# Patient Record
Sex: Female | Born: 1945 | Race: White | Hispanic: No | Smoking: Never smoker
Health system: Southern US, Community
[De-identification: ages and names within clinical notes are randomized; demographics above are authoritative.]

## PROBLEM LIST (undated history)

## (undated) DIAGNOSIS — M1990 Unspecified osteoarthritis, unspecified site: Secondary | ICD-10-CM

## (undated) DIAGNOSIS — G20 Parkinson's disease: Secondary | ICD-10-CM

## (undated) DIAGNOSIS — G20A1 Parkinson's disease without dyskinesia, without mention of fluctuations: Secondary | ICD-10-CM

## (undated) DIAGNOSIS — E785 Hyperlipidemia, unspecified: Secondary | ICD-10-CM

## (undated) DIAGNOSIS — N390 Urinary tract infection, site not specified: Secondary | ICD-10-CM

## (undated) DIAGNOSIS — G629 Polyneuropathy, unspecified: Secondary | ICD-10-CM

## (undated) DIAGNOSIS — F32A Depression, unspecified: Secondary | ICD-10-CM

## (undated) DIAGNOSIS — I10 Essential (primary) hypertension: Secondary | ICD-10-CM

## (undated) DIAGNOSIS — C50919 Malignant neoplasm of unspecified site of unspecified female breast: Secondary | ICD-10-CM

## (undated) DIAGNOSIS — F329 Major depressive disorder, single episode, unspecified: Secondary | ICD-10-CM

## (undated) HISTORY — DX: Major depressive disorder, single episode, unspecified: F32.9

## (undated) HISTORY — DX: Parkinson's disease: G20

## (undated) HISTORY — DX: Depression, unspecified: F32.A

## (undated) HISTORY — DX: Polyneuropathy, unspecified: G62.9

## (undated) HISTORY — PX: REVISION TOTAL KNEE ARTHROPLASTY: SHX767

## (undated) HISTORY — DX: Unspecified osteoarthritis, unspecified site: M19.90

## (undated) HISTORY — DX: Parkinson's disease without dyskinesia, without mention of fluctuations: G20.A1

## (undated) HISTORY — PX: OTHER SURGICAL HISTORY: SHX169

## (undated) HISTORY — DX: Essential (primary) hypertension: I10

## (undated) HISTORY — DX: Malignant neoplasm of unspecified site of unspecified female breast: C50.919

## (undated) HISTORY — PX: TOTAL KNEE REVISION: SHX996

## (undated) HISTORY — DX: Hyperlipidemia, unspecified: E78.5

---

## 1980-10-04 HISTORY — PX: ABDOMINAL HYSTERECTOMY: SHX81

## 1990-10-04 HISTORY — PX: RENAL ANGIOPLASTY: SHX2316

## 1997-10-04 HISTORY — PX: ABDOMINAL EXPLORATION SURGERY: SHX538

## 2012-04-03 HISTORY — PX: BREAST LUMPECTOMY: SHX2

## 2012-04-28 HISTORY — PX: MASTECTOMY: SHX3

## 2017-08-29 ENCOUNTER — Encounter: Payer: Self-pay | Admitting: Neurology

## 2017-09-15 ENCOUNTER — Encounter: Payer: Self-pay | Admitting: Neurology

## 2017-09-20 NOTE — Progress Notes (Addendum)
Deborah Vincent was seen today in the movement disorders clinic for neurologic consultation at the request of Hadi, Ehsan.  The consultation is for the evaluation of Parkinsons disease.  She was previously taken care of by [facility] group in [place], [place].  I have reviewed notes available to me.  She is accompanied by her daughter who supplements the history.  Records indicate that the patient's first symptom was in 2012.  Her first symptom was difficulty ambulating.  The first medication that she was placed on was azilect and then pramipexole was added at low dose.  She was last seen in the clinic in [place] in July, 2018.  At that point time it was recommended that her pramipexole be increased.  She is supposed to be on 0.25 mg, 1.5 tablets/ 1.5 tablets/ 2 tablets/half a tablet.  This was a total daily pramipexole dosage of 1.375 mg.  She sometimes takes 0.25 mg, 1.5 tablets at 7am, 0.5 tablets at 2pm and 2 tablets at bedtime.  She states that sometimes she will add 1/2 tablet at 4am to avoid AM tremor.   She thinks that medication helps but makes her nauseated.  This is a total daily dose of 1.0-1.25mg daily    Specific Symptoms:  Tremor: Yes.  , R arm and right leg Family hx of similar:  No. Voice: gotten softer Sleep: trouble staying asleep  Vivid Dreams:  No.  Acting out dreams:  No. Wet Pillows: No. Postural symptoms:  Yes.    Falls?  Yes.  , one time getting out of a recliner Bradykinesia symptoms: shuffling gait, slow movements and difficulty getting out of a chair (tried to do LSVT BIG but had back pain and stopped.  Did well with LSVT Loud 2 years ago) Loss of smell:  No. Loss of taste:  No. Urinary Incontinence:  No. Difficulty Swallowing:  No. Handwriting, micrographia: Yes.   Trouble with ADL's:  Yes.  , but can do these things and attributes to arthritis in the knees  Trouble buttoning clothing: No. Depression:  Yes.   Memory changes:  Yes.   - not as  good as used to be.  Living with daughter right now but planning on buying house near daughter.  Daughter would like caregivers to come in Hallucinations:  No.  visual distortions: No. N/V:  No. Lightheaded:  No.  Syncope: No. Diplopia:  No. Dyskinesia:  No.  Neuroimaging of the brain has previously been performed.  It is not available for my review today.  PREVIOUS MEDICATIONS: Mirapex and azilect  ALLERGIES:   Allergies  Allergen Reactions  . Nubain [Nalbuphine Hcl]   . Sulfa Antibiotics   . Vicodin [Hydrocodone-Acetaminophen]     CURRENT MEDICATIONS:  Outpatient Encounter Medications as of 09/22/2017  Medication Sig  . acetaminophen (TYLENOL) 325 MG tablet Take 650 mg by mouth every 6 (six) hours as needed.  Marland Kitchen anastrozole (ARIMIDEX) 1 MG tablet Take 1 mg by mouth daily.  . Ascorbic Acid (VITAMIN C) 1000 MG tablet Take 1,000 mg by mouth daily.  Marland Kitchen aspirin 325 MG tablet Take 325 mg by mouth daily.  Marland Kitchen buPROPion (WELLBUTRIN) 100 MG tablet Take 100 mg by mouth daily.  . folic acid (FOLVITE) 1 MG tablet Take 1 mg by mouth daily.  Marland Kitchen l-methylfolate-B6-B12 (METANX) 3-35-2 MG TABS tablet Take 1 tablet by mouth daily.  Marland Kitchen lisinopril (PRINIVIL,ZESTRIL) 5 MG tablet Take 5 mg by mouth daily.  Marland Kitchen MAGNESIUM CITRATE PO Take by mouth.  Marland Kitchen  Melatonin 5 MG TABS Take by mouth.  . pramipexole (MIRAPEX) 0.25 MG tablet 1.5 in the morning, 0.5 in the afternoon, 2 in the evening  . rasagiline (AZILECT) 1 MG TABS tablet Take 1 mg by mouth daily.  . shark liver oil-cocoa butter (PREPARATION H) 0.25-3-85.5 % suppository Place 1 suppository rectally as needed for hemorrhoids.  . [DISCONTINUED] pramipexole (MIRAPEX) 0.25 MG tablet Take by mouth. 1.5 in the morning, 1.5 in the afternoon, 2 in the evening   No facility-administered encounter medications on file as of 09/22/2017.     PAST MEDICAL HISTORY:   Past Medical History:  Diagnosis Date  . Breast cancer ([place])   . Depression   . Hyperlipidemia   .  Hypertension   . Neuropathy   . Osteoarthritis   . Parkinson's disease ([place])     PAST SURGICAL HISTORY:   Past Surgical History:  Procedure Laterality Date  . ABDOMINAL EXPLORATION SURGERY  1999   intestinal adhesions  . ABDOMINAL HYSTERECTOMY  1982  . BREAST LUMPECTOMY Left 04/03/2012  . MASTECTOMY Left 04/28/2012  . RENAL ANGIOPLASTY Left 1992    SOCIAL HISTORY:   Social History   Socioeconomic History  . Marital status: Unknown    Spouse name: Not on file  . Number of children: Not on file  . Years of education: Not on file  . Highest education level: Not on file  Social Needs  . Financial resource strain: Not on file  . Food insecurity - worry: Not on file  . Food insecurity - inability: Not on file  . Transportation needs - medical: Not on file  . Transportation needs - non-medical: Not on file  Occupational History  . Occupation: retired    Comment: Designer, jewellery  Tobacco Use  . Smoking status: Never Smoker  . Smokeless tobacco: Never Used  Substance and Sexual Activity  . Alcohol use: No    Frequency: Never    Comment: prior alcohol dependence  . Drug use: Not on file  . Sexual activity: Not on file  Other Topics Concern  . Not on file  Social History Narrative  . Not on file    FAMILY HISTORY:   Family Status  Relation Name Status  . Mother  Deceased  . Father  Deceased  . Sister  Deceased  . Brother  Deceased  . Brother  Alive  . Child 2 Alive  . Child step Alive    ROS:  Feels nauseated.  A complete 10 system review of systems was obtained and was unremarkable apart from what is mentioned above.  PHYSICAL EXAMINATION:    VITALS:   Vitals:   09/22/17 0859  BP: 140/88  Pulse: 96  SpO2: 96%  Weight: 196 lb (88.9 kg)  Height: 5' 1" (1.549 m)    GEN:  The patient appears stated age.  She doesn't feel well. HEENT:  Normocephalic, atraumatic.  The mucous membranes are moist. The superficial temporal arteries are without ropiness or  tenderness. CV:  RRR Lungs:  CTAB Neck/HEME:  There are no carotid bruits bilaterally.  Neurological examination:  Orientation: The patient is alert and oriented x3. Fund of knowledge is appropriate.  Recent and remote memory are intact.  Attention and concentration are normal.    Able to name objects and repeat phrases. Cranial nerves: There is good facial symmetry.  She has facial hypomania.  She stopped the examination before I was able to look at her pupils or examine the fundus.  The visual  fields are full to confrontational testing. The speech is fluent and clear.  She is hypophonic.  Soft palate rises symmetrically and there is no tongue deviation. Hearing is intact to conversational tone. Sensation: Sensation is intact to light touch.  She stopped the examination before more sensitive aspects of the sensory testing could be tested. Motor: Strength is 5/5 in the bilateral upper and lower extremities.   Shoulder shrug is equal and symmetric.  There is no pronator drift. Deep tendon reflexes: Deep tendon reflexes are 2/4 at the bilateral biceps, triceps, brachioradialis, patella and achilles. Plantar responses are downgoing bilaterally.  Movement examination: Tone: There is mild increased tone in the right upper extremity.  There is moderate increased tone in the right lower extremity.  There is normal tone on the left. Abnormal movements: There is intermittent right upper extremity resting tremor. Coordination:  There is decremation with RAM's, with finger taps on the right and hand opening and closing on the left.  She has significant trouble with heel taps and toe taps on the right.  Other rapid alternating movements are good. Gait and Station: The patient pushes off of the chair to get out of the chair.  She shuffles.  She otherwise does not allow for ambulatory testing.  She does not allow for pole testing.  She stopped the examination because she did not feel well and felt like she was  going to throw up.  labs  Lab work received from primary care physician dated June 07, 2017.  Sodium was 139, potassium 4.8, chloride 100, CO2 23, BUN 22, creatinine 0.96 and glucose 107.  Hemoglobin A1c was 5.9.  ASSESSMENT/PLAN:  1.  Idiopathic Parkinson's disease.  The patient has tremor, bradykinesia, rigidity and postural instability.  -We discussed the diagnosis as well as pathophysiology of the disease.  We discussed treatment options as well as prognostic indicators.  Patient education was provided.  -We discussed that it used to be thought that levodopa would increase risk of melanoma but now it is believed that Parkinsons itself likely increases risk of melanoma. she is to get regular skin checks.  -Greater than 50% of the 45 minute visit was spent in counseling answering questions and talking about what to expect now as well as in the future.  We talked about medication options as well as potential future surgical options.  We talked about safety in the home.  -I think that the patient would greatly benefit from levodopa.  She and I talked about risks and benefits of this.  We talked about the fact that she had significant rigidity.  In addition, she is complaining about cognitive change and nausea with her current medication.  She is on a very low-dose of pramipexole.  My recommendation would be to add levodopa and to slowly wean the pramipexole when she is up to a better dose of levodopa.  She would like to follow-up with her neurologist in [place].  She already has an appointment next month.  She would like to discuss this with him.  -The patient did not do well with LSVT big because of back pain.  We talked about the Parkinson's wellness recovery therapy program here.  I think she would be able to engage with this.  We discussed this with her today.  She can decide if she would like a referral and will let me know if she would like me to send that.  -As above, she is complaining  about some cognitive change.  We talked  about neurocognitive testing and the benefit of that.  I would not recommend that until her medications get reworked.  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  -We discussed community resources in the area including patient support groups and community exercise programs for PD and pt education was provided to the patient.  -She met with my Parkinson's social worker today.   Cc:  System, Pcp Not In

## 2017-09-22 ENCOUNTER — Ambulatory Visit: Payer: Medicare Other | Admitting: Neurology

## 2017-09-22 ENCOUNTER — Encounter: Payer: Self-pay | Admitting: Neurology

## 2017-09-22 VITALS — BP 140/88 | HR 96 | Ht 61.0 in | Wt 196.0 lb

## 2017-09-22 DIAGNOSIS — G20 Parkinson's disease: Secondary | ICD-10-CM | POA: Diagnosis not present

## 2018-04-18 DIAGNOSIS — F32A Depression, unspecified: Secondary | ICD-10-CM | POA: Insufficient documentation

## 2018-04-18 DIAGNOSIS — I10 Essential (primary) hypertension: Secondary | ICD-10-CM | POA: Insufficient documentation

## 2018-04-18 DIAGNOSIS — G20 Parkinson's disease: Secondary | ICD-10-CM | POA: Insufficient documentation

## 2018-04-18 DIAGNOSIS — M545 Low back pain, unspecified: Secondary | ICD-10-CM | POA: Insufficient documentation

## 2018-04-18 DIAGNOSIS — M1712 Unilateral primary osteoarthritis, left knee: Secondary | ICD-10-CM | POA: Insufficient documentation

## 2018-05-30 ENCOUNTER — Telehealth: Payer: Self-pay | Admitting: *Deleted

## 2018-05-30 ENCOUNTER — Ambulatory Visit: Payer: Medicare Other | Admitting: Neurology

## 2018-05-30 ENCOUNTER — Encounter: Payer: Self-pay | Admitting: Neurology

## 2018-05-30 VITALS — BP 118/88 | HR 100 | Ht 62.0 in | Wt 195.0 lb

## 2018-05-30 DIAGNOSIS — G20 Parkinson's disease: Secondary | ICD-10-CM

## 2018-05-30 NOTE — Progress Notes (Signed)
Subjective:    Patient ID: Deborah Vincent is a 72 y.o. female.  HPI     Star Age, MD, PhD [facility] [address], [address] [place], [place] [address]  Dear Danton Sewer,   I saw your patient, Deborah Vincent, upon your kind request, for initial consultation of her parkinsonism. The patient is accompanied by her daughter and her niece today. As you know, Deborah Vincent is a 72 year old right-handed woman with an underlying medical history of hypertension, breast cancer with status post mastectomy, low back pain, asthma, depression, insomnia, and obesity, who was diagnosed with Parkinson's disease in or around 2012. She has been on Azilect, Sinemet generic and Mirapex generic. She was seen last year by Dr. Carles Collet at [facility] neurology and I reviewed the note from 09/22/2017. Patient was advised to start levodopa at the time. She is currently on Sinemet generic 25-100 milligrams strength one pill 3 times a day, Azilect generic 1 mg strength one pill once daily, Mirapex generic 0.25 mg strength one pill 3 times a day. She has a Hx of pseudobulbar affect. Sleeps okay, wakes up 2 times a night for a bathroom break. Takes at least one nap during the day. Feels very nauseated. Started the Sinemet in 10/2017. Started the [place] in May 2016, Mirapex was started in November 2016. She has noted nausea after a while of taking the C/L. She is taking Mirapex 0.25 mg at 3 AM, noon and 8 PM. She takes Sinemet at 3 AM, 7 AM, 11 AM, 4 PM and 8 or 9 PM. She takes the Azilect in the morning typically between 8 and 10. She is in physical therapy currently. She does not typically go to the gym. She has low back pain and bilateral knee pain, left more than right. She has had issues with constipation. This became severely worse when she tried Imodium for nausea in the past. She has no family history of Parkinson's disease. She currently lives with a roommate and stays with her nephew in  [place]. She is retired, widowed, has 3 children, she is a nonsmoker. She drinks caffeine in the form of coffee, half cup per day on average. She tries to hydrate well. She feels that her nausea typically comes on before she is due for her next Sinemet dose. The nausea becomes worse after she takes it for about 20 minutes and then subsides, she feels the Sinemet work well for her.   Her Past Medical History Is Significant For: Past Medical History:  Diagnosis Date  . Breast cancer ([place])   . Depression   . Hyperlipidemia   . Hypertension   . Neuropathy   . Osteoarthritis   . Parkinson's disease ([place])     Her Past Surgical History Is Significant For: Past Surgical History:  Procedure Laterality Date  . ABDOMINAL EXPLORATION SURGERY  1999   intestinal adhesions  . ABDOMINAL HYSTERECTOMY  1982  . BREAST LUMPECTOMY Left 04/03/2012  . MASTECTOMY Left 04/28/2012  . RENAL ANGIOPLASTY Left 1992    Her Family History Is Significant For: Family History  Problem Relation Age of Onset  . Cancer Mother        GI   . Hypertension Mother   . Depression Mother   . Lung cancer Father   . Other Sister        Wagner's granuloma    Her Social History Is Significant For: Social History   Socioeconomic History  . Marital status: Unknown  Spouse name: Not on file  . Number of children: Not on file  . Years of education: Not on file  . Highest education level: Not on file  Occupational History  . Occupation: retired    Comment: Designer, jewellery  Social Needs  . Financial resource strain: Not on file  . Food insecurity:    Worry: Not on file    Inability: Not on file  . Transportation needs:    Medical: Not on file    Non-medical: Not on file  Tobacco Use  . Smoking status: Never Smoker  . Smokeless tobacco: Never Used  Substance and Sexual Activity  . Alcohol use: No    Frequency: Never    Comment: prior alcohol dependence  . Drug use: Not on file  . Sexual activity:  Not on file  Lifestyle  . Physical activity:    Days per week: Not on file    Minutes per session: Not on file  . Stress: Not on file  Relationships  . Social connections:    Talks on phone: Not on file    Gets together: Not on file    Attends religious service: Not on file    Active member of club or organization: Not on file    Attends meetings of clubs or organizations: Not on file    Relationship status: Not on file  Other Topics Concern  . Not on file  Social History Narrative  . Not on file    Her Allergies Are:  Allergies  Allergen Reactions  . Macrobid [Nitrofurantoin Macrocrystal]   . Nubain [Nalbuphine Hcl]   . Sulfa Antibiotics   . Vicodin [Hydrocodone-Acetaminophen]   :   Her Current Medications Are:  Outpatient Encounter Medications as of 05/30/2018  Medication Sig  . 5-Methyltetrahydrofolate (METHYL FOLATE) POWD 400 mg.  . acetaminophen (TYLENOL) 325 MG tablet Take 650 mg by mouth every 6 (six) hours as needed.  Marland Kitchen anastrozole (ARIMIDEX) 1 MG tablet Take 1 mg by mouth daily.  . Ascorbic Acid (VITAMIN C) 1000 MG tablet Take 1,000 mg by mouth daily.  Marland Kitchen aspirin 325 MG tablet Take 325 mg by mouth daily.  Marland Kitchen buPROPion (WELLBUTRIN) 100 MG tablet Take 100 mg by mouth daily.  . carbidopa-levodopa (SINEMET IR) 25-100 MG tablet Take 1 tablet by mouth 3 (three) times daily.  Marland Kitchen lisinopril (PRINIVIL,ZESTRIL) 5 MG tablet Take 5 mg by mouth daily.  Marland Kitchen MAGNESIUM CITRATE PO Take by mouth.  . Melatonin 5 MG TABS Take by mouth.  . pramipexole (MIRAPEX) 0.25 MG tablet Take 0.25 mg by mouth 3 (three) times daily. 1.5 in the morning, 0.5 in the afternoon, 2 in the evening   . rasagiline (AZILECT) 1 MG TABS tablet Take 1 mg by mouth daily.  . shark liver oil-cocoa butter (PREPARATION H) 0.25-3-85.5 % suppository Place 1 suppository rectally as needed for hemorrhoids.  . [DISCONTINUED] folic acid (FOLVITE) 1 MG tablet Take 1 mg by mouth daily.  . [DISCONTINUED] l-methylfolate-B6-B12  (METANX) 3-35-2 MG TABS tablet Take 1 tablet by mouth daily.   No facility-administered encounter medications on file as of 05/30/2018.   : Review of Systems:  Out of a complete 14 point review of systems, all are reviewed and negative with the exception of these symptoms as listed below:  Review of Systems  Neurological:       Pt presents today to discuss her PD. Pt was diagnosed in 63 in [place]. Pt has seen Dr. Carles Collet in the past but pt  reports that her PCP would prefer her to see Dr. Rexene Alberts.    Objective:  Neurological Exam  Physical Exam Physical Examination:   Vitals:   05/30/18 1013  BP: 118/88  Pulse: 100    General Examination: The patient is a very pleasant 72 y.o. female in no acute distress. She appears well-developed and well-nourished and well groomed.   HEENT: Normocephalic, atraumatic, pupils are equal, round and reactive, extraocular tracking is mildly impaired, she has mild facial masking and mild nuchal rigidity. Hearing is grossly intact. She has no obvious sialorrhea. Airway examination is benign. She has no lip, neck or jaw tremor. She has mild hypophonia.  Chest: Clear to auscultation without wheezing, rhonchi or crackles noted.  Heart: S1+S2+0, regular and normal without murmurs, rubs or gallops noted.   Abdomen: Soft, non-tender and non-distended with normal bowel sounds appreciated on auscultation.  Extremities: There is no obvious edema, mild puffiness noted.   Skin: Warm and dry without trophic changes noted.   Musculoskeletal: exam reveals L knee pain, left knee in a brace.   Neurologically:  Mental status: The patient is awake, alert and oriented in all 4 spheres. Her immediate and remote memory, attention, language skills and fund of knowledge are appropriate. There is no evidence of aphasia, agnosia, apraxia or anomia. Speech is mildly hypophonic, no dysarthria noted. Cranial nerves II - XII are as described above under HEENT exam.  Motor  exam: Normal bulk, global strength of 4+ out of 5 is noted. She has no significant resting tremor today. She has increase in tone particularly in the right upper extremity with cogwheeling noted. Fine motor skills are mild to moderately impaired globally, seems slightly worse on the left. She stands up with mild difficulty, she has a mildly stooped posture, she has decreased arm swing bilaterally, right more than left when walking. She has a 3 pronged cane. Sensory exam: intact to light touch.  Assessment and Plan:   In summary, CZARINA GINGRAS is a very pleasant 72 y.o.-year old female with an underlying medical history of hypertension, breast cancer with status post mastectomy, low back pain, asthma, depression, insomnia, and obesity, who presents for evaluation of her Parkinson's disease. She was diagnosed in 2015 with right-sided symptoms at the time. She has signs and symptoms of parkinsonism, with right sided predominance noted. She does not have any significant tremor at this time. She is on triple therapy with Azilect, low-dose Mirapex and Sinemet 5 times a day. She is taking her medication fairly on schedule but starts rather early around 3 or 4 AM with her first dose of Sinemet and Mirapex. She has had nausea, which seems to get worse as the levodopa wears off and better after the levodopa kicks in which is somewhat unusual. She has had issues with constipation. She does not take any medication for this. It looks like she was given a prescription for Linzess, but is not taking it. She reports some sleep disruption, cognitively and mood wise she is stable. She is in the process of transferring care and moving from [place] to [place]. She has an appointment with her current neurologist in [place] coming up within the next couple of months. She is encouraged to keep that appointment and once she has moved here I can see her back in follow-up. For that reason, I did not suggest any  medication changes as yet as medication changes can result in side effects and complications and motor setbacks. She is agreeable to following  up in about 6 months.  We talked about medical treatments and non-pharmacological approaches. We talked about the importance of maintaining a healthy lifestyle in general. I encouraged the patient to eat healthy, exercise daily and keep well hydrated, to keep a scheduled bedtime and wake time routine. In particular, I stressed the importance of regular exercise, within of course the patient's own mobility limitations. She reports back pain and L knee pain. I will request records from her neurologist in [place]. As far as medications are concerned, I recommended the following at this time: no change.  I answered all their questions today and the use patient and her family were in agreement.  Thank you very much for allowing me to participate in the care of this nice patient. If I can be of any further assistance to you please do not hesitate to call me at [phone].  Sincerely,   Star Age, MD, PhD

## 2018-05-30 NOTE — Telephone Encounter (Signed)
I faxed request to Dr Caryn Section @ [facility] fax # [phone]

## 2018-05-30 NOTE — Telephone Encounter (Signed)
MR received, given to Dr. Rexene Alberts for review.

## 2018-05-30 NOTE — Patient Instructions (Addendum)
We will keep you medication regimen the same for now. As discussed, we will arrange for follow-up appointment in February.  Try to stay active physically and mentally. Engage in social activities in your community and with your family and try to keep up with current events by reading the newspaper or watching the news. Try to do word puzzles and you may like to do puzzles and brain games on the computer such as on [email or web address]   Please be really proactive with your constipation medication regimen, titrating as needed to where you have a formed stool at least every other day.   I would like to see you back in 6 months.

## 2018-06-26 ENCOUNTER — Ambulatory Visit (HOSPITAL_COMMUNITY)
Admission: RE | Admit: 2018-06-26 | Discharge: 2018-06-26 | Disposition: A | Discharge status: Home / Self Care | Payer: Medicare Other | Source: Ambulatory Visit | Attending: Nurse Practitioner | Admitting: Nurse Practitioner

## 2018-06-26 ENCOUNTER — Other Ambulatory Visit (HOSPITAL_COMMUNITY): Payer: Self-pay | Admitting: Nurse Practitioner

## 2018-06-26 DIAGNOSIS — R112 Nausea with vomiting, unspecified: Secondary | ICD-10-CM

## 2018-06-26 DIAGNOSIS — R937 Abnormal findings on diagnostic imaging of other parts of musculoskeletal system: Secondary | ICD-10-CM | POA: Diagnosis not present

## 2018-06-26 DIAGNOSIS — R109 Unspecified abdominal pain: Secondary | ICD-10-CM | POA: Insufficient documentation

## 2018-07-20 ENCOUNTER — Telehealth: Payer: Self-pay | Admitting: Neurology

## 2018-07-20 NOTE — Telephone Encounter (Signed)
Error

## 2018-07-24 ENCOUNTER — Telehealth: Payer: Self-pay | Admitting: Psychology

## 2018-07-24 NOTE — Telephone Encounter (Signed)
The patient's daughter contact me today to set up a time to meet with both the patient and she together in my office prior to their appointment with Dr. Carles Collet on November 1.  They wanted to talk about resources.  The patient's daughter had attended 1 of my Parkinson's for caregiver support group 2 months ago.  I made an appointment with them for tomorrow October 22 at 2 PM and will document any pertinent information that will be helpful for their appointment on November 1 in the clinic.

## 2018-07-25 ENCOUNTER — Encounter: Payer: Self-pay | Admitting: Psychology

## 2018-07-25 NOTE — Progress Notes (Signed)
Today I met with the patient, her daughter and son-in-law.  This meeting was initiated by the patient's daughter. During today's visit I provided resources, helped to organize/ prioritize some of their needs that they presented with and answered some questions that they had in regards to outside resources.   The patient is from [facility] and is currently residing in [place].  For a while she was thinking that she would go back and forth and consider [place] her residents half of the year and [place] the other half.  However, they have decided that they will visit [place] some and that they have decided to keep all the patient's primary care here in [place] and reside here.  The patient now has her own home that is a few houses away from her daughter and son-in-law in [place].  The patient has been experiencing mild to severe nausea several times a day and his nausea has been interrupting her ability to participate in activities. They had an appointment in November, but were able to get worked in to see Dr. Carles Collet tomorrow (10/23) morning.  I made a copy of the medication record that the patient made to help explain her periods of nausea. I shared that I would give this to Dr. Carles Collet prior to the patient's appointment tomorrow. The nausea and falls during the "off period"  times have been a great concern for the patient.   Some of the resource needs that the patient inquired about are related to those concerns were home modifications, life alert system and nursing aid services. Resources provided/topics discussed are as follow:  Home Modifications: She expressed that she would like to know an organization that can make recommendations on home modifications.  I discussed home health occupational therapy and physical therapy as being an option to make these recommendations.  However, [employer] of vocational rehabilitation and independent living services can do this as well.   There is financial assistance for some modifications and programs that are income based if needed. Caregiver Services: The patient inquired about a home health aide.  I provided her information on [facility], [facility], and [facility]. 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  Life Alert Systems: The patient inquired about a life alert unit.  I provided her with information on the units that [place] has endorsed through [facility]. Counseling: As I talked with the patient about resources for support, it was identified that the patient may benefit from individual counseling or more of a talk therapy type group.  I recommended the power over Parkinson's group for information/resources.  If they are willing to drive to [facility] for a more therapeutic talk therapy group that would be appropriate.  I provided them with the information on  [facility] for counseling services.    In addition to those resources, I reviewed the Parkinson's patient packet and reviewed exercise programs, support groups and other relevant information in our community that can help support her to live well with Parkinson's disease.

## 2018-07-25 NOTE — Progress Notes (Signed)
Deborah Vincent was seen today in the movement disorders clinic for neurologic consultation at the request of [name], MD.  The consultation is for the evaluation of Parkinsons disease.  She was previously taken care of by [facility] group in [place], [place].  I have reviewed notes available to me.  She is accompanied by her daughter who supplements the history.  Records indicate that the patient's first symptom was in 2012.  Her first symptom was difficulty ambulating.  The first medication that she was placed on was azilect and then pramipexole was added at low dose.  She was last seen in the clinic in [place] in July, 2018.  At that point time it was recommended that her pramipexole be increased.  She is supposed to be on 0.25 mg, 1.5 tablets/ 1.5 tablets/ 2 tablets/half a tablet.  This was a total daily pramipexole dosage of 1.375 mg.  She sometimes takes 0.25 mg, 1.5 tablets at 7am, 0.5 tablets at 2pm and 2 tablets at bedtime.  She states that sometimes she will add 1/2 tablet at 4am to avoid AM tremor.   She thinks that medication helps but makes her nauseated.  This is a total daily dose of 1.0-1.25mg daily   07/26/18 update: Patient is seen today in follow-up for Parkinson's disease.  I have not seen her in almost a year.  At that point, I recommended that she start levodopa and wean off of pramipexole because of complaints of cognitive change.  She wanted to talk with her neurologist in [facility] before making changes.  I have not heard from her since.  Today, the patient reports that she is on carbidopa/levodopa 25/100, 2am/6am/10am/2pm/6pm/10pm, pramipexole 0.25 mg, 1.5 tablets at 10am (0.375 mg), 1 at 2pm and 1.5 tablets at 10pm (total of 1 mg/day of pramipexole) and rasagiline, 1 mg daily.  Reports having nausea at off times.  She feels confident nausea is related to off times and not other medications like Wellbutrin.  States that she takes levodopa in the middle of the  night because of the nausea.  She states that nausea does not wake her up in the middle of the night, but when she wakes up to go to the bathroom, she will have nausea and takes levodopa and it makes it better.  Reports taking CBD oil.   She has some freezing episodes.  She describes some moving of the right foot.  She is napping twice during the day, 20-40 min each time and then lays down each time she is nauseated at an off time.  She is also having constipation.  She is drinking 6 glasses of water per day.  Daughter thinks she has depression.  PREVIOUS MEDICATIONS: Mirapex and azilect  ALLERGIES:   Allergies  Allergen Reactions  . Macrobid [Nitrofurantoin Macrocrystal]   . Nubain [Nalbuphine Hcl]   . Sulfa Antibiotics   . Vicodin [Hydrocodone-Acetaminophen]     CURRENT MEDICATIONS:  Outpatient Encounter Medications as of 07/26/2018  Medication Sig  . 5-Methyltetrahydrofolate (METHYL FOLATE) POWD 400 mg.  . acetaminophen (TYLENOL) 325 MG tablet Take 650 mg by mouth every 6 (six) hours as needed.  Marland Kitchen anastrozole (ARIMIDEX) 1 MG tablet Take 1 mg by mouth daily.  . Ascorbic Acid (VITAMIN C) 1000 MG tablet Take 1,000 mg by mouth daily.  Marland Kitchen aspirin 325 MG tablet Take 325 mg by mouth daily.  Marland Kitchen buPROPion (WELLBUTRIN) 100 MG tablet Take 100 mg by mouth daily.  . carbidopa-levodopa (SINEMET IR)  25-100 MG tablet Take 1 tablet by mouth 3 (three) times daily.  Marland Kitchen linaclotide (LINZESS) 290 MCG CAPS capsule Take 290 mcg by mouth daily before breakfast.  . lisinopril (PRINIVIL,ZESTRIL) 5 MG tablet Take 5 mg by mouth daily.  Marland Kitchen MAGNESIUM CITRATE PO Take by mouth.  . Melatonin 5 MG TABS Take by mouth.  Marland Kitchen OVER THE COUNTER MEDICATION   . pramipexole (MIRAPEX) 0.25 MG tablet Take 0.25 mg by mouth 3 (three) times daily. 1.5 in the morning, 0.5 in the afternoon, 2 in the evening   . rasagiline (AZILECT) 1 MG TABS tablet Take 1 mg by mouth daily.  . shark liver oil-cocoa butter (PREPARATION H) 0.25-3-85.5 %  suppository Place 1 suppository rectally as needed for hemorrhoids.  . carbidopa-levodopa (SINEMET CR) 50-200 MG tablet Take 1 tablet by mouth at bedtime.   No facility-administered encounter medications on file as of 07/26/2018.     PAST MEDICAL HISTORY:   Past Medical History:  Diagnosis Date  . Breast cancer ([place])   . Depression   . Hyperlipidemia   . Hypertension   . Neuropathy   . Osteoarthritis   . Parkinson's disease ([place])     PAST SURGICAL HISTORY:   Past Surgical History:  Procedure Laterality Date  . ABDOMINAL EXPLORATION SURGERY  1999   intestinal adhesions  . ABDOMINAL HYSTERECTOMY  1982  . BREAST LUMPECTOMY Left 04/03/2012  . MASTECTOMY Left 04/28/2012  . RENAL ANGIOPLASTY Left 1992    SOCIAL HISTORY:   Social History   Socioeconomic History  . Marital status: Unknown    Spouse name: Not on file  . Number of children: Not on file  . Years of education: Not on file  . Highest education level: Not on file  Occupational History  . Occupation: retired    Comment: Designer, jewellery  Social Needs  . Financial resource strain: Not on file  . Food insecurity:    Worry: Not on file    Inability: Not on file  . Transportation needs:    Medical: Not on file    Non-medical: Not on file  Tobacco Use  . Smoking status: Never Smoker  . Smokeless tobacco: Never Used  Substance and Sexual Activity  . Alcohol use: No    Frequency: Never    Comment: prior alcohol dependence  . Drug use: Not on file  . Sexual activity: Not on file  Lifestyle  . Physical activity:    Days per week: Not on file    Minutes per session: Not on file  . Stress: Not on file  Relationships  . Social connections:    Talks on phone: Not on file    Gets together: Not on file    Attends religious service: Not on file    Active member of club or organization: Not on file    Attends meetings of clubs or organizations: Not on file    Relationship status: Not on file  . Intimate  partner violence:    Fear of current or ex partner: Not on file    Emotionally abused: Not on file    Physically abused: Not on file    Forced sexual activity: Not on file  Other Topics Concern  . Not on file  Social History Narrative  . Not on file    FAMILY HISTORY:   Family Status  Relation Name Status  . Mother  Deceased  . Father  Deceased  . Sister  Deceased  . Brother  Deceased  .  Brother  Alive  . Child 2 Alive  . Child step Alive    ROS:  Review of Systems  Constitutional: Positive for malaise/fatigue.  Respiratory: Negative.   Gastrointestinal: Positive for nausea.  Genitourinary: Negative.   Musculoskeletal: Positive for falls.  Skin: Negative.   Endo/Heme/Allergies: Negative.   Psychiatric/Behavioral: Positive for depression.     PHYSICAL EXAMINATION:    VITALS:   Vitals:   07/26/18 0820  BP: 128/66  Pulse: 98  SpO2: 94%  Weight: 187 lb (84.8 kg)  Height: 5' 2" (1.575 m)    GEN:  The patient appears stated age.   HEENT:  Normocephalic, atraumatic.  The mucous membranes are moist. The superficial temporal arteries are without ropiness or tenderness. CV: Regular rate rhythm. Lungs: Clear to auscultation bilaterally. Neck/HEME: No bruits.  Neurological examination:  Orientation: Alert and oriented x3. Cranial nerves: There is good facial symmetry.  She has facial hypomimia.  She has square wave jerks.  EOMI. speech is fluent and clear.  The soft palate rises symmetrically.  Hearing is intact to conversational tone. Sensation: Sensation is intact to light touch.  Motor: Strength is 5/5 in the bilateral upper and lower extremities.   Shoulder shrug is equal and symmetric.  There is no pronator drift.   Movement examination: Tone: There is mild increased tone in the RUE.  There is normal tone in the LUE Abnormal movements: There is no tremor today. Coordination:  There is decremation with RAM's, with finger taps on the right and hand opening and  closing on the left.  She has significant trouble with heel taps and toe taps on the right.  Other rapid alternating movements are good. Gait and Station: The patient is able to arise without the use of her hands.  She does not shuffle today and actually walks quite well down the hall.  She does have some trouble in the turns.    labs  Lab work received from primary care physician dated June 07, 2017.  Sodium was 139, potassium 4.8, chloride 100, CO2 23, BUN 22, creatinine 0.96 and glucose 107.  Hemoglobin A1c was 5.9.  ASSESSMENT/PLAN:  1.  Parkinson's disease.  The patient has tremor, bradykinesia, rigidity and postural instability.  -atypical states cannot be ruled out (square wave jerks/falls), but perhaps less likely given that symptoms are reported since 2012.  -She is taking levodopa strangely, including waking up in the middle of the night to take it.  I am going to discontinue her 2 a.m. dose and her 10 PM dose and instead replace those 2 dosages with a carbidopa/levodopa 50/200 CR at bedtime.  -She will continue carbidopa/levodopa 25/100, 1 tablet at 6 AM/10 AM/2 PM/6 PM.  I may add entacapone to this next visit.  -will send referral to neurorehab next visit as they refused at this visit because they are going to be traveling back to [place].  -Talked about the importance of continuity of care, which is going to be important for her.  She has followed with me, her neurologist in [facility], saw Dr. Rexene Alberts in August.  She really needs to pick one movement physician and follow with that person, which ever she chooses.  -she met with my social worker yesterday  2.  Constipation  -rancho recipe given  -talked about hydration  -exercise is important  -get me copy of labs  3.  Depression   -don't want to start a med yet but highly recommend a counselor  4.  Nausea  -She  reports that this is a symptom of off today.  I am not completely convinced.  Last visit, she reported that  this was a side effect of medication (more likely than a symptom of off).  Today, she reports that it is a side effect of off and that if she takes levodopa it helps (last time she was not even on levodopa and she had the issue).  Regardless, I am trying to switch around her medications to see if we can make things better.  5.  Follow up is anticipated in the next 2 months, sooner should new neurologic issues arise.  Much greater than 50% of this visit was spent in counseling and coordinating care.  Total face to face time:  30 min  Cc:  [name], MD

## 2018-07-26 ENCOUNTER — Other Ambulatory Visit: Payer: Self-pay

## 2018-07-26 ENCOUNTER — Encounter: Payer: Self-pay | Admitting: Neurology

## 2018-07-26 ENCOUNTER — Ambulatory Visit: Payer: Medicare Other | Admitting: Neurology

## 2018-07-26 VITALS — BP 128/66 | HR 98 | Ht 62.0 in | Wt 187.0 lb

## 2018-07-26 DIAGNOSIS — K5901 Slow transit constipation: Secondary | ICD-10-CM

## 2018-07-26 DIAGNOSIS — G20 Parkinson's disease: Secondary | ICD-10-CM

## 2018-07-26 DIAGNOSIS — F330 Major depressive disorder, recurrent, mild: Secondary | ICD-10-CM

## 2018-07-26 MED ORDER — CARBIDOPA-LEVODOPA ER 50-200 MG PO TBCR: 1.0000 | EXTENDED_RELEASE_TABLET | Freq: Every day | ORAL | 1 refills | Status: DC

## 2018-07-26 NOTE — Patient Instructions (Addendum)
Continue carbidopa/levodopa 25/100, 1 tablet at 6am/10am/2pm/6pm Start carbidopa/levodopa 50/200 at 10 pm  Constipation and Parkinson's disease:  1.Rancho recipe for constipation in Parkinsons Disease:  -1 cup of unprocessed bran (need to get this at [employer], [employer] or similar type of store), 2 cups of applesauce in 1 cup of prune juice 2.  Increase fiber intake (Metamucil,vegetables) 3.  Regular, moderate exercise can be beneficial. 4.  Avoid medications causing constipation, such as medications like antacids with calcium or magnesium 5.  Laxative overuse should be avoided. 6.  Stool softeners (Colace) can help with chronic constipation. 7.  Increase water intake.  You should be drinking 1/2 gallon of water a day as long as you have not been diagnosed with congestive heart failure or renal/kidney failure.  This is probably the single greatest thing that you can do to help your constipation.

## 2018-08-04 ENCOUNTER — Ambulatory Visit: Payer: Medicare Other | Admitting: Neurology

## 2018-08-04 ENCOUNTER — Encounter

## 2018-08-07 ENCOUNTER — Telehealth: Payer: Self-pay | Admitting: Neurology

## 2018-08-07 DIAGNOSIS — R110 Nausea: Secondary | ICD-10-CM

## 2018-08-07 NOTE — Telephone Encounter (Signed)
I investigated this a little more and when I saw her the very first visit (long before she started carbidopa/levodopa) she was c/o nausea.  At that time she said that the pramipexole caused the nausea.  I'm just wondering if there isn't something else going on.  Also, I noticed that she has an appt with Dr. Rexene Alberts coming up (she saw her in between my first visit and my last visit with her).  She certainly can go to that appt but for her own health, she probably needs to stick with one neurologist ([place]).

## 2018-08-07 NOTE — Telephone Encounter (Signed)
Per my notes, I may add entacapone next visit but given nausea before starting levodopa even, a GI eval would be a good option.  She has a f/u with me in January

## 2018-08-07 NOTE — Telephone Encounter (Signed)
On the phone she did state that they were planning on keeping you as a neurologist, my suspicious is that they haven't cancelled that appointment yet. Do you want me to suggest to them that she should see a gastroenterologist about the nausea?

## 2018-08-07 NOTE — Telephone Encounter (Signed)
Daughter made aware. They would like to see GI. Referral entered.

## 2018-08-07 NOTE — Telephone Encounter (Signed)
Patient's daughter is calling in stating that her recent medication change has been good for her but she is having some nauseous times during the day. 5xs a day and they last about 20mins to an hour. Please call her back at [phone]. Thanks!

## 2018-08-07 NOTE — Telephone Encounter (Signed)
Spoke with patient's daughter.  She states patient doing well with addition of Carbidopa Levodopa 50/200 at bedtime. She is able to get some sleep. Daughter is now calling up to see what to do about daytime symptoms. She states you stated you didn't want to change too much at once and thought that since she is doing well on new medication that something new could be added now. I let her know that this still is probably still too soon to add another medication, but I would run it by you.  She states patient will start having episodes of freezing about an hour before daytime Levodopa is due. She will then start getting nausea and vomiting about 30 minutes prior to taking Levodopa. She states if changes aren't make today, could she call in a week? I think she wants to know a clear time line of when to expect changes with medications.  Please advise.

## 2018-08-14 ENCOUNTER — Telehealth: Payer: Self-pay | Admitting: Neurology

## 2018-08-14 NOTE — Telephone Encounter (Signed)
Spoke with patient. She states she couldn't take the nausea anymore and decided to try to take the Carbidopa Levodopa CR during the day. She stopped the Carbidopa Levodopa IR and is taking Carbidopa Levodopa CR 1 TID about every 8 hours. She states she is not nauseated all day anymore and isn't having as much freezing.  She wants to know if she can continue this - she is still going to proceed with GI workup. Please advise.

## 2018-08-14 NOTE — Telephone Encounter (Signed)
Patient said that she needed to speak with the nurse. She said that it was too much to relay to just call her. Please call her back at [phone]. Thanks!

## 2018-08-15 MED ORDER — CARBIDOPA-LEVODOPA ER 25-100 MG PO TBCR: 1.0000 | EXTENDED_RELEASE_TABLET | Freq: Three times a day (TID) | ORAL | 2 refills | Status: DC

## 2018-08-15 NOTE — Telephone Encounter (Signed)
No.  Since the CR was double the dose that was not advisable.   She has told me 1.  mirapex caused the nausea.  2.  Off episodes caused the nausea and taking levodopa helped - this is what she said last visit and on the phone and now 3.  The IR caused it.  You can d/c the 25/100 IR and change to 25/100 CR but I will not RX the 50/200 CR during the daytime.

## 2018-08-15 NOTE — Telephone Encounter (Signed)
Patient made aware.  She is agreeable to CR dose of 25/100 Carbidopa Levodopa. RX sent to pharmacy.

## 2018-10-26 NOTE — Progress Notes (Signed)
Deborah Vincent was seen today in the movement disorders clinic for neurologic consultation at the request of [name], MD.  The consultation is for the evaluation of Parkinsons disease.  She was previously taken care of by [facility] group in [place], [place].  I have reviewed notes available to me.  She is accompanied by her daughter who supplements the history.  Records indicate that the patient's first symptom was in 2012.  Her first symptom was difficulty ambulating.  The first medication that she was placed on was azilect and then pramipexole was added at low dose.  She was last seen in the clinic in [place] in July, 2018.  At that point time it was recommended that her pramipexole be increased.  She is supposed to be on 0.25 mg, 1.5 tablets/ 1.5 tablets/ 2 tablets/half a tablet.  This was a total daily pramipexole dosage of 1.375 mg.  She sometimes takes 0.25 mg, 1.5 tablets at 7am, 0.5 tablets at 2pm and 2 tablets at bedtime.  She states that sometimes she will add 1/2 tablet at 4am to avoid AM tremor.   She thinks that medication helps but makes her nauseated.  This is a total daily dose of 1.0-1.25mg  daily   07/26/18 update: Patient is seen today in follow-up for Parkinson's disease.  I have not seen her in almost a year.  At that point, I recommended that she start levodopa and wean off of pramipexole because of complaints of cognitive change.  She wanted to talk with her neurologist in [facility] before making changes.  I have not heard from her since.  Today, the patient reports that she is on carbidopa/levodopa 25/100, 2am/6am/10am/2pm/6pm/10pm, pramipexole 0.25 mg, 1.5 tablets at 10am (0.375 mg), 1 at 2pm and 1.5 tablets at 10pm (total of 1 mg/day of pramipexole) and rasagiline, 1 mg daily.  Reports having nausea at off times.  She feels confident nausea is related to off times and not other medications like Wellbutrin.  States that she takes levodopa in the middle of the  night because of the nausea.  She states that nausea does not wake her up in the middle of the night, but when she wakes up to go to the bathroom, she will have nausea and takes levodopa and it makes it better.  Reports taking CBD oil.   She has some freezing episodes.  She describes some moving of the right foot.  She is napping twice during the day, 20-40 min each time and then lays down each time she is nauseated at an off time.  She is also having constipation.  She is drinking 6 glasses of water per day.  Daughter thinks she has depression.  10/30/18 update: Patient is seen today in follow-up for Parkinson's disease.  She is accompanied by her brother who supplements the history.  I changed the way she dosed her medication last visit.  I had her take carbidopa/levodopa 25/100, 1 tablet at 6 AM/10 AM/2 PM/6 PM and told her to take carbidopa/levodopa 50/200 at bedtime.  They called me not long after that visit to state that she was doing better at bedtime, but was having episodes of freezing and nausea during the day.  She thought that the nausea was due to NEED for levodopa.  I investigated and reviewed her prior records, and noted that she had complained about nausea long before that (the year prior) and at that time, she actually thought that the pramipexole caused the nausea.  I told them before I changed medications, I would recommend a gastroenterology referral.  I can see that the gastroenterology office called them several times, but had to leave messages to schedule the appointment, and no one ever called them back.  She does bring with her today notes from a hospitalization in [place].  States that she went in for n/v.  She saw GI.  She had an EGD and colonoscopy.  Polyps were removed.  She was given Zofran.  She had a CT of the abdomen and pelvis.  Cholelithiasis was noted, and stated that the stone was larger than prior exam.  Today, they state that she has completely changed around her meds.   States that she went back to her prior neurologist in [place].  She is now on carbidopa/levodopa 25/100 CR 1 at 6am, 1/2 at noon, 1 at 2pm, 1/2 at 8pm, 1 at 11pm.  Noting more "off periods" after 5-6 hours of taking levodopa but when asked what that means she describes depression. Crying during the day.  Having anxiety and "really effects my day and ability to function."  States that occurs mostly with wearing off.   She is off of carbidopa/levodopa 50/200 at bedtime.  She is taking pramipexole 0.25 mg at bedtime and azilect 1 mg at bedtime.  She is noting more speech trouble.  Having L knee pain - saw ortho but injection didn't help.  Still having some constipation.  On linzess with PCP.    PREVIOUS MEDICATIONS: Mirapex and azilect  ALLERGIES:   Allergies  Allergen Reactions  . Macrobid [Nitrofurantoin Macrocrystal]   . Nubain [Nalbuphine Hcl]   . Sulfa Antibiotics   . Vicodin [Hydrocodone-Acetaminophen]     CURRENT MEDICATIONS:  Outpatient Encounter Medications as of 10/30/2018  Medication Sig  . 5-Methyltetrahydrofolate (METHYL FOLATE) POWD 400 mg.  . acetaminophen (TYLENOL) 325 MG tablet Take 650 mg by mouth every 6 (six) hours as needed.  Marland Kitchen anastrozole (ARIMIDEX) 1 MG tablet Take 1 mg by mouth daily.  . Ascorbic Acid (VITAMIN C) 1000 MG tablet Take 1,000 mg by mouth daily.  Marland Kitchen aspirin 325 MG tablet Take 325 mg by mouth daily.  Marland Kitchen buPROPion (WELLBUTRIN) 100 MG tablet Take 100 mg by mouth 2 (two) times daily.   . Carbidopa-Levodopa ER (SINEMET CR) 25-100 MG tablet controlled release Take 1 tablet by mouth 3 (three) times daily. (Patient taking differently: Take by mouth. 1 at 6am/ 1/2 at 12pm/ 1 at 2pm/ 1/2 at 8 pm/ 1 at 11 pm)  . diclofenac sodium (VOLTAREN) 1 % GEL Apply topically 4 (four) times daily.  . Lido-Capsaicin-Men-Methyl Sal (MEDI-PATCH-LIDOCAINE EX) Apply 4 % topically as needed.  . linaclotide (LINZESS) 290 MCG CAPS capsule Take 290 mcg by mouth daily before breakfast.  .  lisinopril (PRINIVIL,ZESTRIL) 5 MG tablet Take 5 mg by mouth daily.  Marland Kitchen MAGNESIUM CITRATE PO Take by mouth.  . ondansetron (ZOFRAN-ODT) 4 MG disintegrating tablet Take 4 mg by mouth as needed.  Marland Kitchen OVER THE COUNTER MEDICATION Pain relief foot cream  . pramipexole (MIRAPEX) 0.25 MG tablet Take 0.25 mg by mouth at bedtime.  . rasagiline (AZILECT) 1 MG TABS tablet Take 1 mg by mouth daily.  . shark liver oil-cocoa butter (PREPARATION H) 0.25-3-85.5 % suppository Place 1 suppository rectally as needed for hemorrhoids.  . [DISCONTINUED] carbidopa-levodopa (SINEMET CR) 50-200 MG tablet Take 1 tablet by mouth at bedtime.  . [DISCONTINUED] Melatonin 5 MG TABS Take by mouth.  . [DISCONTINUED] pramipexole (MIRAPEX) 0.25 MG  tablet Take 0.25 mg by mouth 3 (three) times daily. 1.5 in the morning, 0.5 in the afternoon, 2 in the evening    No facility-administered encounter medications on file as of 10/30/2018.     PAST MEDICAL HISTORY:   Past Medical History:  Diagnosis Date  . Breast cancer ([place])   . Depression   . Hyperlipidemia   . Hypertension   . Neuropathy   . Osteoarthritis   . Parkinson's disease ([place])     PAST SURGICAL HISTORY:   Past Surgical History:  Procedure Laterality Date  . ABDOMINAL EXPLORATION SURGERY  1999   intestinal adhesions  . ABDOMINAL HYSTERECTOMY  1982  . BREAST LUMPECTOMY Left 04/03/2012  . MASTECTOMY Left 04/28/2012  . RENAL ANGIOPLASTY Left 1992    SOCIAL HISTORY:   Social History   Socioeconomic History  . Marital status: Unknown    Spouse name: Not on file  . Number of children: Not on file  . Years of education: Not on file  . Highest education level: Not on file  Occupational History  . Occupation: retired    Comment: Designer, jewellery  Social Needs  . Financial resource strain: Not on file  . Food insecurity:    Worry: Not on file    Inability: Not on file  . Transportation needs:    Medical: Not on file    Non-medical: Not on file  Tobacco  Use  . Smoking status: Never Smoker  . Smokeless tobacco: Never Used  Substance and Sexual Activity  . Alcohol use: No    Frequency: Never    Comment: prior alcohol dependence  . Drug use: Not on file  . Sexual activity: Not on file  Lifestyle  . Physical activity:    Days per week: Not on file    Minutes per session: Not on file  . Stress: Not on file  Relationships  . Social connections:    Talks on phone: Not on file    Gets together: Not on file    Attends religious service: Not on file    Active member of club or organization: Not on file    Attends meetings of clubs or organizations: Not on file    Relationship status: Not on file  . Intimate partner violence:    Fear of current or ex partner: Not on file    Emotionally abused: Not on file    Physically abused: Not on file    Forced sexual activity: Not on file  Other Topics Concern  . Not on file  Social History Narrative  . Not on file    FAMILY HISTORY:   Family Status  Relation Name Status  . Mother  Deceased  . Father  Deceased  . Sister  Deceased  . Brother  Deceased  . Brother  Alive  . Child 2 Alive  . Child step Alive    ROS:  Review of Systems  Constitutional: Positive for malaise/fatigue.  HENT: Negative.   Eyes: Negative.   Respiratory: Negative.   Cardiovascular: Negative.   Gastrointestinal: Positive for constipation (some better), diarrhea (goes back and forth between diarrhea and constipation) and nausea (improved).  Skin: Negative.   Neurological: Positive for tremors.  Psychiatric/Behavioral: Positive for depression.     PHYSICAL EXAMINATION:    VITALS:   Vitals:   10/30/18 1309  BP: 122/80  Pulse: 94  SpO2: 96%    GEN:  The patient appears stated age and is in NAD.  The patient is  very tearful. HEENT:  Normocephalic, atraumatic.  The mucous membranes are moist. The superficial temporal arteries are without ropiness or tenderness. CV:  RRR Lungs:  CTAB Neck/HEME:  There  are no carotid bruits bilaterally.  Neurological examination:  Orientation: The patient is alert and oriented x3. Cranial nerves: There is good facial symmetry. The speech is fluent and clear. Soft palate rises symmetrically and there is no tongue deviation. Hearing is intact to conversational tone. Sensation: Sensation is intact to light touch throughout Motor: Strength is 5/5 in the bilateral upper and lower extremities.   Shoulder shrug is equal and symmetric.  There is no pronator drift.   Movement examination: Tone: There is normal tone in the upper and lower extremities. Abnormal movements: There is no tremor today. Coordination:  There is no decremation today with rapid alternating movements. Gait and Station: The patient pushes off of the chair to arise.  She then walks in the hall very well, but with purposeful arm movements and while walking she count out loud, "1, 2, 1, 2, 1, 2."  labs  Lab work received from primary care physician dated June 07, 2017.  Sodium was 139, potassium 4.8, chloride 100, CO2 23, BUN 22, creatinine 0.96 and glucose 107.  Hemoglobin A1c was 5.9.  ASSESSMENT/PLAN:  1.  Parkinson's disease.  The patient has tremor, bradykinesia, rigidity and postural instability.  -She again returns taking levodopa strangely.  She takes it at 6 AM and then does not take it until 12 PM, and then takes another half tablet at 2 PM, and then waits again until 8 PM and then takes another at 11 PM.  I think her medications need to be spaced more evenly and then she will notice less wearing off.  She will change carbidopa/levodopa 25/100 CR, 1 tablet at 6 AM/10 AM/2 PM/6 PM/11 PM.  We did discuss entacapone, but ultimately decided that this was a better way to go.  -will send referral to physical and speech therapy at [facility].  -Talked about the importance of continuity of care, which is going to be important for her.  Discussed this last visit as well but has continued to  seek care in [place].  Discussed that multiple physicians changing meds has not and is not good for her.  Also discussed that she can certainly choose any neurologist that she wishes, but continuity of care is going to be very important for her, especially as her disease progresses.   2.  Constipation  -Primary care is now managing.  She is on Linzess.  Offered referral to gastroenterology, who she also saw in [place].  We had referred her last visit, but she did not return the phone call.  She wishes to hold off for right now.  3.  Depression   -significant crying and depression today.  Daughter is moving back to [place] and is contributing to depression.  Needs psychiatry and counselor.  Was agreeable to the referral.  She thought that this was an off phenomenon, but took her medication about an hour and 20 minutes before I went in, and was very tearful and crying.  4.  Nausea  -The first time that I saw the patient she thought that this was a side effect of pramipexole.  The second time that I saw the patient, she thought that this was an off phenomenon and felt that levodopa helped it.  Today, she stated that she went to [place] and her neurologist took her off of the immediate  release and put her on the extended release and told her that it was from too much levodopa (although the symptoms started long before she was even on levodopa).  She has seen gastroenterology in [place].  She really needs to follow-up with them.  Gastroenterology in [place] gave her Zofran, but she also has cholelithiasis.  She is doing somewhat better in regards to the nausea.  I told her to make sure she is taking her medication with a carbohydrate.  She states that her neurologist in [place] took her off of protein altogether, but I told her I want her eating protein to give her energy and sustain her, I just want her to keep it away from her medication by about 30 minutes.  5.  Follow up is anticipated in the next  few months, sooner should new neurologic issues arise.  Much greater than 50% of this visit was spent in counseling and coordinating care.  Total face to face time:  40 min  Cc:  [name], MD

## 2018-10-27 ENCOUNTER — Encounter: Payer: Self-pay | Admitting: Neurology

## 2018-10-27 ENCOUNTER — Encounter: Payer: Self-pay | Admitting: Internal Medicine

## 2018-10-30 ENCOUNTER — Ambulatory Visit: Payer: Medicare Other | Admitting: Neurology

## 2018-10-30 ENCOUNTER — Telehealth: Payer: Self-pay | Admitting: Neurology

## 2018-10-30 ENCOUNTER — Encounter: Payer: Self-pay | Admitting: Neurology

## 2018-10-30 VITALS — BP 122/80 | HR 94

## 2018-10-30 DIAGNOSIS — F331 Major depressive disorder, recurrent, moderate: Secondary | ICD-10-CM | POA: Diagnosis not present

## 2018-10-30 DIAGNOSIS — G20 Parkinson's disease: Secondary | ICD-10-CM | POA: Diagnosis not present

## 2018-10-30 DIAGNOSIS — R110 Nausea: Secondary | ICD-10-CM

## 2018-10-30 DIAGNOSIS — K5901 Slow transit constipation: Secondary | ICD-10-CM

## 2018-10-30 NOTE — Telephone Encounter (Signed)
Called patients daughter - Sandi Raveling which is the number listed in the patients chart and also the name listed on the DPR. I explained to Phaedra that during her mothers office visit today that she was given another patients AVS in error. She gave me the phone # to Rush Landmark and Mariann Laster which is Magdalene's brother and sister-in-law [phone]). I called and spoke to [place] and explained that Livvy was given the incorrect AVS in error and that we needed to have that AVS returned as well as for her to pick up the correct AVS. Mariann Laster verbalized understanding and stated that she would be in the area tomorrow and would return the AVS and pick-up the correct one. She verbalized understanding that she was aware that she would be asked to sign a Return to Patient Information form tomorrow when she returned the AVS.

## 2018-10-30 NOTE — Patient Instructions (Signed)
1. Change Carbidopa Levodopa 25/100 CR to the following: 1 tablet at 6 am, 1 tablet at 10 am, 1 tablet at 2 pm, 1 tablet at 6 pm, 1 tablet at 11 pm.   2. Please make appts with Myra Gianotti, LCSW and Cristy Friedlander, MD.   3. Referral sent to [facility] for physical and speech therapy. They will call you directly to schedule.

## 2018-10-31 NOTE — Telephone Encounter (Signed)
Mariann Laster (sister-in-law) to the patient came by this morning per out conversation yesterday and returned the AVS that she received in error yesterday during her office visit. She signed the "Return of Patient Information" form and returned the forms to [facility] at the front desk. Chelsea printed the correct AVS for the patient and she had no further questions or concerns.

## 2018-11-10 ENCOUNTER — Telehealth: Payer: Self-pay | Admitting: Neurology

## 2018-11-10 NOTE — Telephone Encounter (Signed)
These offices should both have access to notes since they are in the [facility] system, but I did fax last office note as requested to the numbers provided.

## 2018-11-10 NOTE — Telephone Encounter (Signed)
Patient called and is needing her office Notes from Dr. Carles Collet faxed to [facility] in [place] and to [facility] for Speech Therapy and PT [phone].  She said they take her Insurance. Thanks

## 2018-11-28 ENCOUNTER — Ambulatory Visit: Payer: Medicare Other | Admitting: Neurology

## 2018-11-30 ENCOUNTER — Telehealth: Payer: Self-pay | Admitting: Neurology

## 2018-11-30 NOTE — Telephone Encounter (Signed)
-----   Message from Merril Abbe, [place] sent at 11/30/2018  8:44 AM EST ----- FYI:  We spoke with your patient and pt said they would call us back. To date, patient has not called back.  Referral closed.  We appreciate the referral. [place]

## 2018-11-30 NOTE — Telephone Encounter (Signed)
Noted  

## 2018-12-04 ENCOUNTER — Ambulatory Visit (HOSPITAL_COMMUNITY): Payer: Medicare Other | Admitting: Speech Pathology

## 2018-12-11 ENCOUNTER — Ambulatory Visit (HOSPITAL_COMMUNITY): Payer: Medicare Other | Admitting: Speech Pathology

## 2018-12-18 ENCOUNTER — Other Ambulatory Visit: Payer: Self-pay | Admitting: Neurology

## 2018-12-19 ENCOUNTER — Ambulatory Visit (HOSPITAL_COMMUNITY): Payer: Self-pay | Admitting: Psychiatry

## 2018-12-19 NOTE — Progress Notes (Deleted)
Psychiatric Initial Adult Assessment   Patient Identification: Deborah Vincent MRN:  [id number] Date of Evaluation:  12/19/2018 Referral Source: *** Chief Complaint:   Visit Diagnosis: No diagnosis found.  History of Present Illness:   Deborah Vincent is a 73 y.o. year old female with a history of [arkinson's disease, who is referred for depression.     Associated Signs/Symptoms: Depression Symptoms:  {DEPRESSION SYMPTOMS:20000} (Hypo) Manic Symptoms:  {BHH MANIC SYMPTOMS:22872} Anxiety Symptoms:  {BHH ANXIETY SYMPTOMS:22873} Psychotic Symptoms:  {BHH PSYCHOTIC SYMPTOMS:22874} PTSD Symptoms: {BHH PTSD SYMPTOMS:22875}  Past Psychiatric History:  Outpatient:  Psychiatry admission:  Previous suicide attempt:  Past trials of medication:  History of violence:   Previous Psychotropic Medications: {YES/NO:21197}  Substance Abuse History in the last 12 months:  {yes no:314532}  Consequences of Substance Abuse: {BHH CONSEQUENCES OF SUBSTANCE ABUSE:22880}  Past Medical History:  Past Medical History:  Diagnosis Date  . Breast cancer ([place])   . Depression   . Hyperlipidemia   . Hypertension   . Neuropathy   . Osteoarthritis   . Parkinson's disease [facility])     Past Surgical History:  Procedure Laterality Date  . ABDOMINAL EXPLORATION SURGERY  1999   intestinal adhesions  . ABDOMINAL HYSTERECTOMY  1982  . BREAST LUMPECTOMY Left 04/03/2012  . MASTECTOMY Left 04/28/2012  . RENAL ANGIOPLASTY Left 1992    Family Psychiatric History: ***  Family History:  Family History  Problem Relation Age of Onset  . Cancer Mother        GI   . Hypertension Mother   . Depression Mother   . Lung cancer Father   . Other Sister        Wagner's granuloma    Social History:   Social History   Socioeconomic History  . Marital status: Unknown    Spouse name: Not on file  . Number of children: Not on file  . Years of education: Not on file  . Highest education level: Not on  file  Occupational History  . Occupation: retired    Comment: Designer, jewellery  Social Needs  . Financial resource strain: Not on file  . Food insecurity:    Worry: Not on file    Inability: Not on file  . Transportation needs:    Medical: Not on file    Non-medical: Not on file  Tobacco Use  . Smoking status: Never Smoker  . Smokeless tobacco: Never Used  Substance and Sexual Activity  . Alcohol use: No    Frequency: Never    Comment: prior alcohol dependence  . Drug use: Not on file  . Sexual activity: Not on file  Lifestyle  . Physical activity:    Days per week: Not on file    Minutes per session: Not on file  . Stress: Not on file  Relationships  . Social connections:    Talks on phone: Not on file    Gets together: Not on file    Attends religious service: Not on file    Active member of club or organization: Not on file    Attends meetings of clubs or organizations: Not on file    Relationship status: Not on file  Other Topics Concern  . Not on file  Social History Narrative  . Not on file    Additional Social History: ***  Allergies:   Allergies  Allergen Reactions  . Macrobid [Nitrofurantoin Macrocrystal]   . Nubain [Nalbuphine Hcl]   . Sulfa Antibiotics   .  Vicodin [Hydrocodone-Acetaminophen]     Metabolic Disorder Labs: No results found for: HGBA1C, MPG No results found for: PROLACTIN No results found for: CHOL, TRIG, HDL, CHOLHDL, VLDL, LDLCALC No results found for: TSH  Therapeutic Level Labs: No results found for: LITHIUM No results found for: CBMZ No results found for: VALPROATE  Current Medications: Current Outpatient Medications  Medication Sig Dispense Refill  . 5-Methyltetrahydrofolate (METHYL FOLATE) POWD 400 mg.    . acetaminophen (TYLENOL) 325 MG tablet Take 650 mg by mouth every 6 (six) hours as needed.    Marland Kitchen anastrozole (ARIMIDEX) 1 MG tablet Take 1 mg by mouth daily.    . Ascorbic Acid (VITAMIN C) 1000 MG tablet Take 1,000  mg by mouth daily.    Marland Kitchen aspirin 325 MG tablet Take 325 mg by mouth daily.    Marland Kitchen buPROPion (WELLBUTRIN) 100 MG tablet Take 100 mg by mouth 2 (two) times daily.     . Carbidopa-Levodopa ER (SINEMET CR) 25-100 MG tablet controlled release Take 1 tablet by mouth 3 (three) times daily. (Patient taking differently: Take by mouth. 1 at 6am/ 1/2 at 12pm/ 1 at 2pm/ 1/2 at 8 pm/ 1 at 11 pm) 90 tablet 2  . diclofenac sodium (VOLTAREN) 1 % GEL Apply topically 4 (four) times daily.    . Lido-Capsaicin-Men-Methyl Sal (MEDI-PATCH-LIDOCAINE EX) Apply 4 % topically as needed.    . linaclotide (LINZESS) 290 MCG CAPS capsule Take 290 mcg by mouth daily before breakfast.    . lisinopril (PRINIVIL,ZESTRIL) 5 MG tablet Take 5 mg by mouth daily.    Marland Kitchen MAGNESIUM CITRATE PO Take by mouth.    . ondansetron (ZOFRAN-ODT) 4 MG disintegrating tablet Take 4 mg by mouth as needed.    Marland Kitchen OVER THE COUNTER MEDICATION Pain relief foot cream    . pramipexole (MIRAPEX) 0.25 MG tablet Take 0.25 mg by mouth at bedtime.    . rasagiline (AZILECT) 1 MG TABS tablet Take 1 mg by mouth daily.    . shark liver oil-cocoa butter (PREPARATION H) 0.25-3-85.5 % suppository Place 1 suppository rectally as needed for hemorrhoids.     No current facility-administered medications for this visit.     Musculoskeletal: Strength & Muscle Tone: within normal limits Gait & Station: normal Patient leans: N/A  Psychiatric Specialty Exam: ROS  There were no vitals taken for this visit.There is no height or weight on file to calculate BMI.  General Appearance: Fairly Groomed  Eye Contact:  Good  Speech:  Clear and Coherent  Volume:  Normal  Mood:  {BHH MOOD:22306}  Affect:  {Affect (PAA):22687}  Thought Process:  Coherent  Orientation:  Full (Time, Place, and Person)  Thought Content:  Logical  Suicidal Thoughts:  {ST/HT (PAA):22692}  Homicidal Thoughts:  {ST/HT (PAA):22692}  Memory:  Immediate;   Good  Judgement:  {Judgement (PAA):22694}   Insight:  {Insight (PAA):22695}  Psychomotor Activity:  Normal  Concentration:  Concentration: Good and Attention Span: Good  Recall:  Good  Fund of Knowledge:Good  Language: Good  Akathisia:  No  Handed:  Right  AIMS (if indicated):  not done  Assets:  Communication Skills Desire for Improvement  ADL's:  Intact  Cognition: WNL  Sleep:  {BHH GOOD/FAIR/POOR:22877}   Screenings:   Assessment and Plan:  Assessment  Plan  The patient demonstrates the following risk factors for suicide: Chronic risk factors for suicide include: {Chronic Risk Factors for XTGGYIR:48546270}. Acute risk factors for suicide include: {Acute Risk Factors for JJKKXFG:18299371}. Protective factors for this  patient include: {Protective Factors for Suicide MSXJ:15520802}. Considering these factors, the overall suicide risk at this point appears to be {Desc; low/moderate/high:110033}. Patient {ACTION; IS/IS MVV:61224497} appropriate for outpatient follow up.   Norman Clay, MD 3/17/20203:29 PM

## 2018-12-25 ENCOUNTER — Ambulatory Visit (HOSPITAL_COMMUNITY): Payer: Self-pay | Admitting: Psychiatry

## 2019-01-04 ENCOUNTER — Telehealth (HOSPITAL_COMMUNITY): Payer: Self-pay | Admitting: Specialist

## 2019-01-04 NOTE — Telephone Encounter (Signed)
Deborah Vincent was contacted today regarding temporary reduction of Outpatient Rehabilitation Services at [facility] due to concerns for community transmission of COVID-19. I was unable to reach patient, and left a voicemail notifying her of [facility] closure.  We will contact her to reschedule once we have a firm reopen date. Vangie Bicker, [place], OTR/L [phone]

## 2019-01-09 ENCOUNTER — Ambulatory Visit (HOSPITAL_COMMUNITY): Payer: Medicare Other | Admitting: Speech Pathology
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
## 2019-02-05 ENCOUNTER — Other Ambulatory Visit: Payer: Self-pay

## 2019-02-05 ENCOUNTER — Encounter (HOSPITAL_COMMUNITY): Payer: Self-pay | Admitting: Psychiatry

## 2019-02-05 ENCOUNTER — Ambulatory Visit (INDEPENDENT_AMBULATORY_CARE_PROVIDER_SITE_OTHER): Payer: Medicare Other | Admitting: Psychiatry

## 2019-02-05 DIAGNOSIS — F330 Major depressive disorder, recurrent, mild: Secondary | ICD-10-CM | POA: Insufficient documentation

## 2019-02-05 NOTE — Patient Instructions (Addendum)
1. Continue bupropion 100 mg twice a day 2. Next appointment: 6/9 at 1:30  3. Referral to therapy

## 2019-02-09 ENCOUNTER — Encounter: Payer: Self-pay | Admitting: *Deleted

## 2019-02-09 NOTE — Progress Notes (Signed)
Virtual Visit via Video Note (15 min via video, 10 via phone) The purpose of this virtual visit is to provide medical care while limiting exposure to the novel coronavirus.    Consent was obtained for video visit:  Yes.   Answered questions that patient had about telehealth interaction:  Yes.   I discussed the limitations, risks, security and privacy concerns of performing an evaluation and management service by telemedicine. I also discussed with the patient that there may be a patient responsible charge related to this service. The patient expressed understanding and agreed to proceed.  Pt location: Home Physician Location: office Name of referring provider:  [name], MD I connected with Jackelyn Poling Moller at patients initiation/request on 02/12/2019 at 11:00 AM EDT by video enabled telemedicine application and verified that I am speaking with the correct person using two identifiers. Pt MRN:  [id number] Pt DOB:  1945/10/23 Video Participants:  Jackelyn Poling [place]urst;    Current Outpatient Medications on File Prior to Visit  Medication Sig Dispense Refill   5-Methyltetrahydrofolate (METHYL FOLATE) POWD 400 mg.     acetaminophen (TYLENOL) 325 MG tablet Take 650 mg by mouth every 6 (six) hours as needed.     anastrozole (ARIMIDEX) 1 MG tablet Take 1 mg by mouth daily.     Ascorbic Acid (VITAMIN C) 1000 MG tablet Take 1,000 mg by mouth daily.     aspirin 325 MG tablet Take 325 mg by mouth daily.     buPROPion (WELLBUTRIN) 100 MG tablet Take 100 mg by mouth 2 (two) times daily.      Carbidopa-Levodopa ER (SINEMET CR) 25-100 MG tablet controlled release Take 1 tablet by mouth 3 (three) times daily. (Patient taking differently: Take by mouth. 1 at 6am/ 1 at 12pm/ 1 at 5pm/ 1at 9 pm/ 1 at 12pm) 90 tablet 2   diclofenac sodium (VOLTAREN) 1 % GEL Apply topically 4 (four) times daily.     Lido-Capsaicin-Men-Methyl Sal (MEDI-PATCH-LIDOCAINE EX) Apply 4 % topically as needed.      linaclotide (LINZESS) 290 MCG CAPS capsule Take 290 mcg by mouth daily before breakfast.     lisinopril (PRINIVIL,ZESTRIL) 5 MG tablet Take 5 mg by mouth daily.     MAGNESIUM CITRATE PO Take by mouth.     ondansetron (ZOFRAN-ODT) 4 MG disintegrating tablet Take 4 mg by mouth as needed.     OVER THE COUNTER MEDICATION Pain relief foot cream     pramipexole (MIRAPEX) 0.25 MG tablet Take 0.25 mg by mouth at bedtime.     rasagiline (AZILECT) 1 MG TABS tablet Take 1 mg by mouth daily.     shark liver oil-cocoa butter (PREPARATION H) 0.25-3-85.5 % suppository Place 1 suppository rectally as needed for hemorrhoids.     No current facility-administered medications on file prior to visit.      History of Present Illness:  Patient seen today for follow-up of Parkinson's disease.  Medical records have been reviewed since our last visit.  Last visit, I changed her carbidopa/levodopa 25/100 CR to 1 tablet at 6 AM/10 AM/2 PM/6 PM.  She reports that she is actually taking the medication at 6 AM/noon/5 PM/10 PM and the medication wears off about 30 minutes prior and she knows it because she starts crying.  I referred her to psychiatry, psychology and physical therapy services.  She did not get to physical therapy before the pandemic.  We made a referral to [facility] for counseling and she never called  them back.  That referral was closed on February 27.  She did see psychiatry on May 4 and those records are reviewed.  She declined medication, but did ask them for a referral to counseling, which is currently pending.    2 falls since last visit.  With one, she was getting out of the shower and fell.  With the other, she tripped over the cat.  No hallucinations.  No compulsive behavior.  Doing stretching.  States that OA in L knee is "bone on bone" and sees Dr. Veverly Fells and he doesn't want to do knee replacement because she has Parkinsons disease.  She therefore uses scooter all of the time and  feels that she cannot do much exercise.    Observations/Objective:   Vitals:   02/12/19 1008  Weight: 187 lb (84.8 kg)  Height: 5\' 2"  (1.575 m)   GEN:  The patient appears stated age and is in NAD.  Neurological examination:  Orientation: The patient is alert and oriented x3. Cranial nerves: There is good facial symmetry. There is mild facial hypomimia.  The speech is fluent and clear. Soft palate rises symmetrically and there is no tongue deviation. Hearing is intact to conversational tone. Motor: Strength is at least antigravity x 4.   Shoulder shrug is equal and symmetric.  There is no pronator drift.  Movement examination: Tone: unable Abnormal movements: none seen but didn't seen arms well at rest during the visit Coordination:  There is no decremation with RAM's, with hand opening and closing more finger taps, but the video did start freezing at this point Gait and Station: Not tested    Assessment and Plan:   1.  Parkinson's disease.  The patient has tremor, bradykinesia, rigidity and postural instability.             -Continue carbidopa/levodopa 25/100 CR, but encouraged her to take them at the times prescribed rather than the time she is currently taking them.  Have discussed this at previous visits.  Take carbidopa/levodopa 25/100 CR, 1 tablet at 6 AM/10 AM/2 PM/6 PM  -Recommended home physical therapy services because of the pandemic.  She is homebound and cannot get out.  Cannot receive outpatient physical therapy services as they are also closed.  Offered home therapies and she refused             -Discussed barriers of care, primarily that we provide referrals and then they are not followed through upon on patient (patient does not answer telephone and referral is close to help).  This has happened with our referral to counseling, PT, GI.    -Is on very low-dose pramipexole, 0.25 mg at night for restless leg.   2.  Constipation             -Primary care is now  managing.  She is on Linzess.    3.  Depression              -Feels that she is doing somewhat better.  Has a counseling appointment on May 28.  4.  Nausea             -The first time that I saw the patient she thought that this was a side effect of pramipexole.  The second time that I saw the patient, she thought that this was an off phenomenon and felt that levodopa helped it.    Then, she stated that she went to [place] and her neurologist took her off of the immediate  release and put her on the extended release and told her that it was from too much levodopa (although the symptoms started long before she was even on levodopa).    In any case, she seems to be doing better in that regard  5 knee pain  -pt states that she saw ortho and told she couldn't do TKA because of PD.  Explained that I do not have her orthopedic notes, but generally knee arthroplasties can be done under epidural anesthesia.  Patient states that she is thinking of seeking another opinion.  Follow Up Instructions:  Follow-up in 4 to 5 months, sooner should new neurologic issues arise  -I discussed the assessment and treatment plan with the patient. The patient was provided an opportunity to ask questions and all were answered. The patient agreed with the plan and demonstrated an understanding of the instructions.   The patient was advised to call back or seek an in-person evaluation if the symptoms worsen or if the condition fails to improve as anticipated.    Total Time spent in visit with the patient was: 25 minutes, of which more than 50% of the time was spent in counseling and/or coordinating care on home safety.   Pt understands and agrees with the plan of care outlined.     Alonza Bogus, DO

## 2019-02-12 ENCOUNTER — Other Ambulatory Visit: Payer: Self-pay

## 2019-02-12 ENCOUNTER — Encounter: Payer: Self-pay | Admitting: Neurology

## 2019-02-12 ENCOUNTER — Telehealth (INDEPENDENT_AMBULATORY_CARE_PROVIDER_SITE_OTHER): Payer: Medicare Other | Admitting: Neurology

## 2019-02-12 DIAGNOSIS — G2581 Restless legs syndrome: Secondary | ICD-10-CM | POA: Diagnosis not present

## 2019-02-12 DIAGNOSIS — G20 Parkinson's disease: Secondary | ICD-10-CM | POA: Diagnosis not present

## 2019-03-01 ENCOUNTER — Other Ambulatory Visit: Payer: Self-pay

## 2019-03-01 ENCOUNTER — Encounter (HOSPITAL_COMMUNITY): Payer: Self-pay | Admitting: Licensed Clinical Social Worker

## 2019-03-01 ENCOUNTER — Ambulatory Visit (INDEPENDENT_AMBULATORY_CARE_PROVIDER_SITE_OTHER): Payer: Medicare Other | Admitting: Licensed Clinical Social Worker

## 2019-03-01 DIAGNOSIS — F330 Major depressive disorder, recurrent, mild: Secondary | ICD-10-CM
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
## 2019-03-05 ENCOUNTER — Telehealth (HOSPITAL_COMMUNITY): Payer: Self-pay | Admitting: Speech Pathology

## 2019-03-05 NOTE — Telephone Encounter (Signed)
Called To schedule ST Will mail letter requesting a return phone call by 03/20/2019 or referral will be closed- no one answered and no voicemail picked up-unable to l/m.

## 2019-03-06 ENCOUNTER — Ambulatory Visit: Payer: Medicare Other | Admitting: Neurology
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
## 2019-03-13 ENCOUNTER — Ambulatory Visit (INDEPENDENT_AMBULATORY_CARE_PROVIDER_SITE_OTHER): Payer: Medicare Other | Admitting: Psychiatry

## 2019-03-13 ENCOUNTER — Other Ambulatory Visit: Payer: Self-pay

## 2019-03-13 ENCOUNTER — Encounter (HOSPITAL_COMMUNITY): Payer: Self-pay | Admitting: Psychiatry

## 2019-03-13 DIAGNOSIS — F330 Major depressive disorder, recurrent, mild: Secondary | ICD-10-CM | POA: Diagnosis not present

## 2019-03-13 MED ORDER — BUPROPION HCL 100 MG PO TABS: 100.0000 mg | ORAL_TABLET | Freq: Two times a day (BID) | ORAL | 0 refills | Status: DC

## 2019-03-13 NOTE — Patient Instructions (Signed)
1. Continue bupropion 100 mg twice a day 2. Next appointment: 8/3 at 3 PM

## 2019-03-15 ENCOUNTER — Encounter (HOSPITAL_COMMUNITY): Payer: Self-pay | Admitting: Speech Pathology

## 2019-03-15 ENCOUNTER — Other Ambulatory Visit: Payer: Self-pay

## 2019-03-15 ENCOUNTER — Ambulatory Visit (HOSPITAL_COMMUNITY): Payer: Medicare Other | Attending: Neurology | Admitting: Speech Pathology

## 2019-03-15 DIAGNOSIS — R41841 Cognitive communication deficit: Secondary | ICD-10-CM

## 2019-03-15 DIAGNOSIS — R2689 Other abnormalities of gait and mobility: Secondary | ICD-10-CM | POA: Diagnosis present

## 2019-03-15 DIAGNOSIS — R471 Dysarthria and anarthria: Secondary | ICD-10-CM | POA: Insufficient documentation

## 2019-03-15 DIAGNOSIS — R262 Difficulty in walking, not elsewhere classified: Secondary | ICD-10-CM | POA: Diagnosis present

## 2019-03-15 NOTE — Therapy (Signed)
[place], [place], [address] Phone: [phone]   Fax:  [phone]  Speech Language Pathology Evaluation  Patient Details  Name: Deborah Vincent MRN: [id number] Date of Birth: 1946-02-26 Referring Provider (SLP): Alonza Bogus, DO   Encounter Date: 03/15/2019  End of Session - 03/15/19 1216    Visit Number  1    Number of Visits  5    Date for SLP Re-Evaluation  04/19/19    Authorization Type  [employer] Medicare   covered at 100% based on medical necessity   Authorization Time Period  03/15/2019-04/19/2019    SLP Start Time  0903    SLP Stop Time   1000    SLP Time Calculation (min)  57 min    Activity Tolerance  Patient tolerated treatment well       Past Medical History:  Diagnosis Date  . Breast cancer ([place])   . Depression   . Hyperlipidemia   . Hypertension   . Neuropathy   . Osteoarthritis   . Parkinson's disease [facility])     Past Surgical History:  Procedure Laterality Date  . ABDOMINAL EXPLORATION SURGERY  1999   intestinal adhesions  . ABDOMINAL HYSTERECTOMY  1982  . BREAST LUMPECTOMY Left 04/03/2012  . MASTECTOMY Left 04/28/2012  . RENAL ANGIOPLASTY Left 1992    There were no vitals filed for this visit.  Subjective Assessment - 03/15/19 0927    Subjective  "Sometimes I hesitate when I am speaking, but it is better with my medication change."    Patient is accompained by:  Family member    Special Tests  MoCA version 7.3    Currently in Pain?  No/denies         SLP Evaluation [facility] - 03/15/19 0962      SLP Visit Information   SLP Received On  03/15/19    Referring Provider (SLP)  Alonza Bogus, DO    Onset Date  --   2015   Medical Diagnosis  Parkinson's disease      Subjective   Patient/Family Stated Goal  Update HEP      General Information   HPI  Deborah Vincent is a 73 yo female who was referred by Dr. Wells Guiles Tat (Neurology) for a speech/language evaluation due to  diagnosis of Parkinson's disease (tremor, bradykinesia, rigidity, and postural instability). Deborah Vincent recently moved back to the area from [place], [place] (she has brothers here, but children are back in [place]). Pt was initially referred to SLP at the end of January following her neurology appointment when Pt was having increased difficulty with her speech, however she was unable to attend therapy due to transportation difficulties. Her medications were adjusted at that visit and she indicates that she rarely has trouble with her speech, but does endorse executive function deficits. She participated in speech therapy  for a brief time in [place] with a focus on improving her speech. She reports that she does these exercises ~every three days.    Behavioral/Cognition  Alert and cooperative    Mobility Status  ambulates with a walker      Balance Screen   Has the patient fallen in the past 6 months  Yes    How many times?  3    Has the patient had a decrease in activity level because of a fear of falling?   Yes    Is the patient reluctant to leave their home because of a fear of  falling?   Yes      Prior Functional Status   Cognitive/Linguistic Baseline  Within functional limits    Type of Home  House     Lives With  --   caregiver   Available Support  Personal care attendant    Education  Masters, NP    Vocation  Retired      Associate Professor   Overall Cognitive Status  Impaired/Different from baseline    Area of Impairment  Attention;Memory    Current Attention Level  Sustained    Memory  Decreased short-term memory    Memory Comments  2/5 words after 5 minutes; 5/5 with category cue    Attention  Sustained    Sustained Attention  Impaired    Sustained Attention Impairment  Verbal complex;Functional complex    Memory  Impaired    Memory Impairment  Retrieval deficit    Awareness  Appears intact    Problem Solving  Appears intact    Executive Function  Organizing;Self Monitoring     Organizing  Impaired    Organizing Impairment  Functional complex    Self Monitoring  Impaired    Self Monitoring Impairment  Functional complex      Auditory Comprehension   Overall Auditory Comprehension  Appears within functional limits for tasks assessed    Yes/No Questions  Within Functional Limits    Commands  Within Functional Limits    Conversation  Complex    Interfering Components  Attention;Working Recruitment consultant  Within [employer]      Reading Comprehension   Reading Status  Within funtional limits      Expression   Primary Mode of Expression  Verbal      Verbal Expression   Overall Verbal Expression  Appears within functional limits for tasks assessed    Initiation  No impairment    Automatic Speech  Name;Social Response;Month of year    Level of Generative/Spontaneous Verbalization  Conversation    Repetition  No impairment    Naming  No impairment    Pragmatics  No impairment    Non-Verbal Means of Communication  Not applicable      Written Expression   Dominant Hand  Right    Written Expression  Not tested      Oral Motor/Sensory Function   Overall Oral Motor/Sensory Function  Appears within functional limits for tasks assessed    Labial ROM  Within Functional Limits    Labial Symmetry  Within Functional Limits    Labial Strength  Within Functional Limits    Labial Sensation  Within Functional Limits    Labial Coordination  WFL    Lingual ROM  Within Functional Limits    Lingual Symmetry  Within Functional Limits    Lingual Strength  Within Functional Limits    Lingual Sensation  Within Functional Limits    Lingual Coordination  WFL    Facial ROM  Within Functional Limits    Facial Symmetry  Within Functional Limits    Facial Strength  Within Functional Limits    Facial Sensation  Within Functional Limits    Facial Coordination  WFL    Velum  Within Functional Limits    Mandible  Within  Functional Limits    Overall Oral Motor/Sensory Function  --   mild hypomimia     Motor Speech   Overall Motor Speech  Appears within functional limits for tasks assessed  Respiration  Impaired    Level of Impairment  Sentence    Phonation  Normal    Resonance  Within functional limits    Articulation  Within functional limitis    Intelligibility  Intelligible    Motor Planning  Witnin functional limits    Motor Speech Errors  Not applicable    Interfering Components  --   Pt reports speech fluctuations with change in medication   Effective Techniques  Slow rate;Over-articulate;Increased vocal intensity    Phonation  WFL      Standardized Assessments   Standardized Assessments   Montreal Cognitive Assessment (MOCA)    Montreal Cognitive Assessment (MOCA)   25/30   attention and memory errors       SLP Short Term Goals - 03/15/19 1223      SLP SHORT TERM GOAL #1   Title  Pt will implement memory strategies in functional therapy activities with 90% acc with mi/mod cues.    Baseline  2/5 word recall    Time  5    Period  Weeks    Status  New    Target Date  04/19/19      SLP SHORT TERM GOAL #2   Title  Pt will complete moderately complex attention tasks in session with 90% acc with min assist for use of strategies    Baseline  Pt unable to complete mental calcuations/serial 7s beyond one    Time  5    Period  Weeks    Status  New    Target Date  04/19/19      SLP SHORT TERM GOAL #3   Title  Pt will verbalize and demonstrate dysarthria treatment strategies as it relates to Parkinson's disease with min assist from SLP.    Baseline  Pt reports that she completes previous HEP from years ago every three day; will update    Time  5    Period  Weeks    Target Date  04/19/19       SLP Long Term Goals - 03/15/19 1240      SLP LONG TERM GOAL #1   Title  Pt will be independent with HEP for cognitive linguistic strategies and speech/dysarthria with use of written cues.     Baseline  will create HEP    Time  5    Period  Weeks    Status  New    Target Date  04/19/19       Plan - 03/15/19 1219    Clinical Impression Statement  Pt presents with mild cognitive linguistic deficits characterized by difficulty with attention and memory in complex tasks; Speech intelligibility is judged to be WNL, however sustained respiration and phonation tasks are slightly reduced. The MoCA version 7.3 was presented and Pt achieved a score of 25/30 with errors for working memory and attention. 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 She reports feeling overwhelmed by handling her finances due to attention and memory impairments. Pt would like to address these deficits and update her home program related to dysarthria (she feels her speech is good now, but wants it to remain that way). Pt previously had speech therapy several years ago in [place] and wants to be sure she is doing the appropriate exercises as indicated. Recommend short period of SLP services to address the above as Pt is doing quite well right now. SLP explained that services are often revisited throughout the lives of people living with Parkinson's. She  is in agreement to the plan of care.   Speech Therapy Frequency  1x /week    Duration  4 weeks    Treatment/Interventions  Compensatory strategies;Cueing hierarchy;Functional tasks;Patient/family education;SLP instruction and feedback;Internal/external aids;Compensatory techniques    Potential to Achieve Goals  Good    SLP Home Exercise Plan  Pt will be independent with HEP as assigned to facilitate carryover of treatment strategies and techniques in home environment with use of written cues.    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 MDD (major depressive disorder), recurrent episode, mild ([place]) 02/05/2019   Thank you,  Genene Churn, [place]  [facility] 03/15/2019, 12:41 PM  [place] [address] [place], [place], [address] Phone: [phone]   Fax:  [phone]  Name: MADDI COLLAR MRN: [id number] Date of Birth: 12-08-1945

## 2019-03-20 ENCOUNTER — Other Ambulatory Visit: Payer: Self-pay

## 2019-03-20 ENCOUNTER — Ambulatory Visit (HOSPITAL_COMMUNITY): Payer: Medicare Other | Admitting: Speech Pathology

## 2019-03-20 ENCOUNTER — Encounter (HOSPITAL_COMMUNITY): Payer: Self-pay | Admitting: Speech Pathology

## 2019-03-20 DIAGNOSIS — R41841 Cognitive communication deficit: Secondary | ICD-10-CM

## 2019-03-20 DIAGNOSIS — R471 Dysarthria and anarthria: Secondary | ICD-10-CM

## 2019-03-20 NOTE — Therapy (Addendum)
[place], [place], [address] Phone: [phone]   Fax:  [phone]  Speech Language Pathology Treatment  Patient Details  Name: Deborah Vincent MRN: [id number] Date of Birth: 1946/06/08 Referring Provider (SLP): Alonza Bogus, DO   Encounter Date: 03/20/2019  End of Session - 03/20/19 1251    Visit Number  2    Number of Visits  5    Date for SLP Re-Evaluation  04/19/19    Authorization Type  [employer] Medicare   covered at 100% based on medical necessity   Authorization Time Period  03/15/2019-04/19/2019    SLP Start Time  0907    SLP Stop Time   1000    SLP Time Calculation (min)  53 min    Activity Tolerance  Patient tolerated treatment well       Past Medical History:  Diagnosis Date  . Breast cancer ([place])   . Depression   . Hyperlipidemia   . Hypertension   . Neuropathy   . Osteoarthritis   . Parkinson's disease [facility])     Past Surgical History:  Procedure Laterality Date  . ABDOMINAL EXPLORATION SURGERY  1999   intestinal adhesions  . ABDOMINAL HYSTERECTOMY  1982  . BREAST LUMPECTOMY Left 04/03/2012  . MASTECTOMY Left 04/28/2012  . RENAL ANGIOPLASTY Left 1992    There were no vitals filed for this visit.  Subjective Assessment - 03/20/19 1055    Subjective  "I feel nauseous today."    Patient is accompained by:  --   caregiver, Deborah Vincent   Currently in Pain?  No/denies       ADULT SLP TREATMENT - 03/20/19 0001      General Information   Behavior/Cognition  Alert;Cooperative;Pleasant mood    Patient Positioning  Upright in chair    Oral care provided  N/A    HPI  Deborah Vincent is a 73 yo female who was referred by Dr. Wells Guiles Tat (Neurology) for a speech/language evaluation due to diagnosis of Parkinson's disease (tremor, bradykinesia, rigidity, and postural instability). Mrs. Giovanni recently moved back to the area from [place], [place] (she has brothers here, but children are back in  [place]). Pt was initially referred to SLP at the end of January following her neurology appointment when Pt was having increased difficulty with her speech, however she was unable to attend therapy due to transportation difficulties. Her medications were adjusted at that visit and she indicates that she rarely has trouble with her speech, but does endorse executive function deficits. She participated in speech therapy  for a brief time in [place] with a focus on improving her speech. She reports that she does these exercises ~every three days.       Treatment Provided   Treatment provided  Cognitive-Linquistic      Pain Assessment   Pain Assessment  No/denies pain      Cognitive-Linquistic Treatment   Treatment focused on  Dysarthria;Cognition;Patient/family/caregiver education    Skilled Treatment  SLP provided skilled SLP intervention targeting attention, memory, organization, and dysarthria as it relates to Parkinson's disease. Pt was given written information targeting the above. An overview of treatment approaches (LSVT LOUD and Speak out) were reviewed with Pt. She currently is satisfied with her speech, however understands that this may change over time and is motivated to implement strategies in structured tasks to practice. Memory strategies were reviewed and Pt was encouraged to complete "one daily memory" activity on a given piece of paper to  facilitate recall and assist with temporal orientation (a complaint from Pt).       Assessment / Recommendations / Plan   Plan  Continue with current plan of care      Progression Toward Goals   Progression toward goals  Progressing toward goals       SLP Education - 03/20/19 1250    Education Details  Provided memory and speech strategies; daily memory activity    Person(s) Educated  Patient    Methods  Explanation;Handout    Comprehension  Verbalized understanding       SLP Short Term Goals - 03/20/19 1252      SLP SHORT TERM GOAL #1    Title  Pt will implement memory strategies in functional therapy activities with 90% acc with mi/mod cues.    Baseline  2/5 word recall    Time  5    Period  Weeks    Status  On-going    Target Date  04/19/19      SLP SHORT TERM GOAL #2   Title  Pt will complete moderately complex attention tasks in session with 90% acc with min assist for use of strategies    Baseline  Pt unable to complete mental calcuations/serial 7s beyond one    Time  5    Period  Weeks    Status  On-going    Target Date  04/19/19      SLP SHORT TERM GOAL #3   Title  Pt will verbalize and demonstrate dysarthria treatment strategies as it relates to Parkinson's disease with min assist from SLP.    Baseline  Pt reports that she completes previous HEP from years ago every three day; will update    Time  5    Period  Weeks    Status  On-going    Target Date  04/19/19       SLP Long Term Goals - 03/20/19 1253      SLP LONG TERM GOAL #1   Title  Pt will be independent with HEP for cognitive linguistic strategies and speech/dysarthria with use of written cues.    Baseline  will create HEP    Time  5    Period  Weeks    Status  On-going       Plan - 03/20/19 1252    Clinical Impression Statement  Pt presents with mild cognitive linguistic deficits characterized by difficulty with attention and memory in complex tasks; Speech intelligibility is judged to be WNL, however sustained respiration and phonation tasks are slightly reduced. Pt felt nauseous today due to medication effect, so was less talkative than usual per Pt. She was encouraged to review written information provided today. Next session with target memory and speech in functional and structured tasks.     Speech Therapy Frequency  1x /week    Duration  4 weeks    Treatment/Interventions  Compensatory strategies;Cueing hierarchy;Functional tasks;Patient/family education;SLP instruction and feedback;Internal/external aids;Compensatory techniques     Potential to Achieve Goals  Good    SLP Home Exercise Plan  Pt will be independent with HEP as assigned to facilitate carryover of treatment strategies and techniques in home environment with use of written cues.    Consulted and Agree with Plan of Care  Patient       Patient will benefit from skilled therapeutic intervention in order to improve the following deficits and impairments:   1. Cognitive communication deficit   2. Dysarthria and anarthria  Problem List Patient Active Problem List   Diagnosis Date Noted  . MDD (major depressive disorder), recurrent episode, mild ([place]) 02/05/2019    SPEECH THERAPY DISCHARGE SUMMARY  Visits from Start of Care: 2  Current functional level related to goals / functional outcomes: See above; Pt only attended one treatment session   Remaining deficits: See above   Education / Equipment: Initiated  Plan: Patient agrees to discharge.  Patient goals were partially met. Patient is being discharged due to not returning since the last visit.  ?????         Thank you,  Deborah Vincent, [place]  [facility] 03/20/2019, 12:53 PM  [place] [address] [place], [place], [address] Phone: [phone]   Fax:  [phone]   Name: Deborah Vincent MRN: [id number] Date of Birth: 02/07/1946

## 2019-03-21 ENCOUNTER — Ambulatory Visit (HOSPITAL_COMMUNITY): Payer: Medicare Other | Admitting: Licensed Clinical Social Worker

## 2019-03-29 ENCOUNTER — Encounter (HOSPITAL_COMMUNITY): Payer: Self-pay

## 2019-03-29 ENCOUNTER — Ambulatory Visit (HOSPITAL_COMMUNITY): Payer: Medicare Other

## 2019-03-29 ENCOUNTER — Other Ambulatory Visit: Payer: Self-pay

## 2019-03-29 DIAGNOSIS — R41841 Cognitive communication deficit: Secondary | ICD-10-CM | POA: Diagnosis not present

## 2019-03-29 DIAGNOSIS — R262 Difficulty in walking, not elsewhere classified: Secondary | ICD-10-CM

## 2019-03-29 DIAGNOSIS — R2689 Other abnormalities of gait and mobility: Secondary | ICD-10-CM

## 2019-03-29 NOTE — Therapy (Signed)
[place] [facility] [place], [place], [address] Phone: [phone]   Fax:  [phone]  Physical Therapy Evaluation  Patient Details  Name: Deborah Vincent MRN: [id number] Date of Birth: 1945/10/08 Referring Provider (PT): Tat, Eustace Quail, DO   Encounter Date: 03/29/2019  PT End of Session - 03/29/19 1100    Visit Number  1    Number of Visits  6    Date for PT Re-Evaluation  05/10/19    Authorization Type  [employer] Medicare (100% covered, no auth required, no visit limit)    Authorization Time Period  03/29/2019-05/10/2019    Authorization - Visit Number  1    Authorization - Number of Visits  10    PT Start Time  1010    PT Stop Time  1054    PT Time Calculation (min)  44 min    Equipment Utilized During Treatment  Gait belt    Activity Tolerance  Patient tolerated treatment well    Behavior During Therapy  [facility] for tasks assessed/performed       Past Medical History:  Diagnosis Date  . Breast cancer ([place])   . Depression   . Hyperlipidemia   . Hypertension   . Neuropathy   . Osteoarthritis   . Parkinson's disease [facility])     Past Surgical History:  Procedure Laterality Date  . ABDOMINAL EXPLORATION SURGERY  1999   intestinal adhesions  . ABDOMINAL HYSTERECTOMY  1982  . BREAST LUMPECTOMY Left 04/03/2012  . MASTECTOMY Left 04/28/2012  . RENAL ANGIOPLASTY Left 1992    There were no vitals filed for this visit.   Subjective Assessment - 03/29/19 1053   Subjective Patient reports she is limited with walking form her Lt knee pain. She states her gait is "funny" but she was able to ambulate well with her rollator ~ 1 year ago. She is not planning to have a TKA on her Lt knee right now but is working with Dr. Veverly Fells to start stem cell treatment for the pain.  Patient is accompained by:  (caregiver, carolyn)  Limitations Walking;Standing;House hold activities  Pain Assessment  Currently in Pain? Yes  Pain Score 2  Pain  Location Knee  Pain Orientation Left  Pain Descriptors / Indicators Aching;Sore  Pain Type Chronic pain  Pain Onset More than a month ago  Pain Frequency Intermittent  Aggravating Factors  standign and walking  Pain Relieving Factors sitting, rest  Effect of Pain on Daily Activities limits her from walking      [facility] PT Assessment - 03/29/19 0001   Medical Diagnosis Parkinson's  Referring Provider (PT) Tat, Eustace Quail, DO  Prior Therapy Yes in [place] before moving to [facility]  Precautions  Precautions [place]  Has the patient fallen in the past 6 months Yes  How many times? 2 (1x her a kitten tripped her; 1x getting out of shower)  Has the patient had a decrease in activity level because of a fear of falling?  Yes  Is the patient reluctant to leave their home because of a fear of falling?  Yes  [place] Private residence  Living Arrangements Alone  Available Help at Discharge Personal care attendant;Family  Type of [place] to enter;Ramped entrance  Entrance Stairs-Number of Steps  (uses ramp at front door)  Home Layout One level  [place] - 4 wheels;Electric scooter;Grab bars - tub/shower;Shower seat;Hand held shower head (  if tired let caregivers know if she feels tired bf showering)  Additional Comments Patient uses scooter primarily for mobility at home and in the community. She reports ~ 1 year ago she was ambulating with rollator fairly well but has been limited by her Lt knee pain. She is also able to do some cooking and stands at the [employer] and stove; her favorite thing to make is quiche.   Prior Function  Level of Independence Independent with household mobility with device;Requires assistive device for independence;Independent with transfers;Needs assistance with ADLs  Leisure enjoys gardening but has trouble, has some outdoor, and indoor plants  Comments Patient is independent with the electric  scooter and with rollator for short bouts of ambulation in her home. She has an aid with her [place], Beatris Ship, Thurs, and on Sat/Sun. The aids will help with meals and cleaning and she usually showers independently but waits for them to be at the house in case she were to fall.   Cognition  Overall Cognitive Status Within Functional Limits for tasks assessed  Observation/Other Assessments  Focus on Therapeutic Outcomes (FOTO)  NA  Posture/Postural Control  Posture/Postural Control Postural limitations  Postural Limitations Rounded Shoulders;Forward head;Increased thoracic kyphosis  ROM / Strength  AROM / PROM / Strength AROM  AROM  AROM Assessment Site Knee  Right/Left Knee Left;Right  Right Knee Extension 0  Right Knee Flexion 128  Left Knee Extension 0  Left Knee Flexion 121  Transfers  Five time sit to stand comments  16.46 without UE  form wheelchair  Ambulation/Gait  Ambulation/Gait Yes  Ambulation/Gait Assistance 5: Supervision;4: Min guard  Ambulation/Gait Assistance Details guarding for safety  Ambulation Distance (Feet) 200 Feet  Assistive device None  Gait Pattern Step-through pattern;Decreased arm swing - right;Decreased arm swing - left;Decreased step length - right;Decreased step length - left;Decreased stride length;Decreased hip/knee flexion - right;Decreased hip/knee flexion - left;Shuffle;Narrow base of support  Gait Comments Patient's gait observed durign DGI. As patient waas igven multiple tasks her steps became shortened and she developed more of a shuffle   Standardized Balance Assessment  Standardized Balance Assessment Dynamic Gait Index;TUG  Dynamic Gait Index  Level Surface 2  Change in Gait Speed 2  Gait with Horizontal Head Turns 1  Gait with Vertical Head Turns 1  Gait and Pivot Turn 1  Step Over Obstacle 1  Step Around Obstacles 2  Steps 1  Total Score 11  DGI comment: Patient's score indicates she is at a high fall risk and will benefit from balance  interventions  Timed Up and Go Test  TUG Normal TUG  Normal TUG (seconds) 13.7  TUG Comments no assistive device min guard for safety     Objective measurements completed on examination: See above findings.     PT Education - 03/29/19 1102    Education Details  Educated on performance iwth gait/balance tesing and on plan for therapy.    Person(s) Educated  Patient    Methods  Explanation    Comprehension  Verbalized understanding       PT Short Term Goals - 03/29/19 1035      PT SHORT TERM GOAL #1   Title  Patient will be independent with HEP, updated PRN to improve functional strength and posture.    Time  2    Period  Weeks    Status  New    Target Date  04/12/19      PT SHORT TERM GOAL #2   Title  Patient will improve TUG time by 5 seconds to indicate significant improvement in functional balance during gait. (pt can complete with LRAD)    Time  3    Period  Weeks    Status  New    Target Date  04/19/19        PT Long Term Goals - 03/29/19 1038      PT LONG TERM GOAL #1   Title  Patient will improve DGI by 3 points to indicate significant chage in balance during gait to reduce patient's risk of falling while mobilizing.    Time  6    Period  Weeks    Status  New    Target Date  05/10/19      PT LONG TERM GOAL #2   Title  Patient will be able to maintain good upright posture during mixing and food preparation activities (for cooking quiche) to indicate improved muscular endurance wtih postural muscles.    Time  6    Period  Weeks    Status  New    Target Date  05/10/19      PT LONG TERM GOAL #3   Title  Patient will be independent with advanced HEP using PWR! exercises to maintain upright posture and functional balance.    Time  6    Period  Weeks    Status  New    Target Date  05/10/19       Plan - 03/29/19 1052    Clinical Impression Statement  Ms. Risden presents to physical therapy for evaluation of her gait and balance related to her  Parkinson's Disease. She has had PT previously in [place], [place] and has continued to do exercises independently since moving to [facility]. Ms. Kakos ambulates with a rollator at home typically and sometimes with no device for short distances. She primarily uses an electric scooter to get around her home however due to worsening Lt knee pain. She does not currently have plans for a TKA and may initiate stem cell therapy for her knee pain. Ms. Brocious requires supervision/min guard assist for gait with no device today. She was able to complete TUG with no overt LOB but demonstrated slight slowing of cadence with turn. During DGI patient's gait slowed continuously as she was asked to complete more tasks until she came to a stop ~ ambulating ~70 feet. She required rest breaks due to Lt knee pain with walking but denied fatigue. Ms. Popoca also enjoys gardening and cooking but is limited by poor posture, impaired balance, and reduced endurance. She will benefit from skilled PT interventions to address impairments and progress towards goals.    Personal Factors and Comorbidities  Age    Examination-Activity Limitations  Bathing;Carry;Locomotion Level;Stand    Examination-Participation Restrictions  Yard Work;Cleaning;Community Activity;Driving;Laundry    Stability/Clinical Decision Making  Evolving/Moderate complexity    Clinical Decision Making  Moderate    Rehab Potential  Good    PT Treatment/Interventions  ADLs/Self Care Home Management;Aquatic Therapy;Cryotherapy;Electrical Stimulation;Moist Heat;DME Instruction;Gait training;Stair training;Functional mobility training;Therapeutic exercise;Therapeutic activities;Balance training;Neuromuscular re-education;Patient/family education;Manual techniques;Passive range of motion    PT Next Visit Plan  Review goals. Initiate PWR! exercises in sitting and standing. Initaite joint mobilization for pain reduction in Lt knee. Pt will benefit from coordination/timing  activities.    PT Home Exercise Plan  Provide next session: sit<>stand, hip strengthengin at counter, posture exercise    Consulted and Agree with Plan of Care  Patient;Family member/caregiver    Family Member Consulted  caregiver, carolyn        Patient will benefit from skilled therapeutic intervention in order to improve the following deficits and impairments:  Abnormal gait, Decreased activity tolerance, Decreased balance, Decreased coordination, Decreased endurance, Decreased knowledge of use of DME, Decreased mobility, Decreased strength, Difficulty walking, Postural dysfunction  Visit Diagnosis: 1. Difficulty in walking, not elsewhere classified   2. Other abnormalities of gait and mobility        Problem List Patient Active Problem List   Diagnosis Date Noted  . MDD (major depressive disorder), recurrent episode, mild ([place]) 02/05/2019    Kipp Brood, PT, DPT, [facility] Physical Therapist with [facility]  03/29/2019 5:50 PM    [place], [place], [address] Phone: [phone]   Fax:  [phone]  Name: Deborah Vincent MRN: [id number] Date of Birth: 06-09-1946

## 2019-04-03 ENCOUNTER — Ambulatory Visit (HOSPITAL_COMMUNITY)
Admission: RE | Admit: 2019-04-03 | Discharge: 2019-04-03 | Disposition: A | Discharge status: Home / Self Care | Payer: Medicare Other | Source: Ambulatory Visit | Attending: Internal Medicine | Admitting: Internal Medicine

## 2019-04-03 ENCOUNTER — Telehealth (HOSPITAL_COMMUNITY): Payer: Self-pay | Admitting: Internal Medicine

## 2019-04-03 ENCOUNTER — Other Ambulatory Visit: Payer: Self-pay | Admitting: Internal Medicine

## 2019-04-03 ENCOUNTER — Ambulatory Visit (HOSPITAL_COMMUNITY): Payer: Medicare Other | Admitting: Physical Therapy

## 2019-04-03 ENCOUNTER — Other Ambulatory Visit: Payer: Self-pay

## 2019-04-03 ENCOUNTER — Other Ambulatory Visit (HOSPITAL_COMMUNITY): Payer: Self-pay | Admitting: Internal Medicine

## 2019-04-03 DIAGNOSIS — R112 Nausea with vomiting, unspecified: Secondary | ICD-10-CM

## 2019-04-03 NOTE — Telephone Encounter (Signed)
04/03/19  pt cx said that she was really sick and was going to see her doctor

## 2019-04-04 ENCOUNTER — Other Ambulatory Visit: Payer: Self-pay | Admitting: Internal Medicine

## 2019-04-04 ENCOUNTER — Telehealth (HOSPITAL_COMMUNITY): Payer: Self-pay | Admitting: Licensed Clinical Social Worker

## 2019-04-04 ENCOUNTER — Ambulatory Visit (HOSPITAL_COMMUNITY): Payer: Medicare Other | Admitting: Licensed Clinical Social Worker

## 2019-04-04 DIAGNOSIS — R109 Unspecified abdominal pain: Secondary | ICD-10-CM

## 2019-04-04 NOTE — Telephone Encounter (Signed)
I attempted to contact patient with two texts for a video session. No response by patient.

## 2019-04-05 ENCOUNTER — Ambulatory Visit: Payer: Medicare Other

## 2019-04-05 ENCOUNTER — Telehealth (HOSPITAL_COMMUNITY): Payer: Self-pay | Admitting: Internal Medicine

## 2019-04-05 ENCOUNTER — Ambulatory Visit (HOSPITAL_COMMUNITY): Payer: Medicare Other

## 2019-04-05 ENCOUNTER — Emergency Department (HOSPITAL_COMMUNITY): Payer: Medicare Other

## 2019-04-05 ENCOUNTER — Encounter (HOSPITAL_COMMUNITY): Payer: Self-pay | Admitting: Emergency Medicine

## 2019-04-05 ENCOUNTER — Ambulatory Visit (HOSPITAL_COMMUNITY): Payer: Medicare Other | Admitting: Speech Pathology

## 2019-04-05 ENCOUNTER — Other Ambulatory Visit: Payer: Self-pay

## 2019-04-05 ENCOUNTER — Emergency Department (HOSPITAL_COMMUNITY)
Admission: EM | Admit: 2019-04-05 | Discharge: 2019-04-05 | Disposition: A | Discharge status: Home / Self Care | Payer: Medicare Other | Attending: Emergency Medicine | Admitting: Emergency Medicine

## 2019-04-05 DIAGNOSIS — Z79899 Other long term (current) drug therapy: Secondary | ICD-10-CM | POA: Insufficient documentation

## 2019-04-05 DIAGNOSIS — R1012 Left upper quadrant pain: Secondary | ICD-10-CM | POA: Diagnosis present

## 2019-04-05 DIAGNOSIS — I10 Essential (primary) hypertension: Secondary | ICD-10-CM | POA: Diagnosis not present

## 2019-04-05 DIAGNOSIS — K8020 Calculus of gallbladder without cholecystitis without obstruction: Secondary | ICD-10-CM | POA: Diagnosis not present

## 2019-04-05 DIAGNOSIS — G20 Parkinson's disease: Secondary | ICD-10-CM | POA: Insufficient documentation

## 2019-04-05 DIAGNOSIS — Z7982 Long term (current) use of aspirin: Secondary | ICD-10-CM | POA: Insufficient documentation

## 2019-04-05 LAB — CBC WITH DIFFERENTIAL/PLATELET
Abs Immature Granulocytes: 0.07 10*3/uL (ref 0.00–0.07)
Basophils Absolute: 0 10*3/uL (ref 0.0–0.1)
Basophils Relative: 0 %
Eosinophils Absolute: 0.1 10*3/uL (ref 0.0–0.5)
Eosinophils Relative: 1 %
HCT: 46.1 % — ABNORMAL HIGH (ref 36.0–46.0)
Hemoglobin: 14.9 g/dL (ref 12.0–15.0)
Immature Granulocytes: 1 %
Lymphocytes Relative: 22 %
Lymphs Abs: 2.2 10*3/uL (ref 0.7–4.0)
MCH: 30.5 pg (ref 26.0–34.0)
MCHC: 32.3 g/dL (ref 30.0–36.0)
MCV: 94.5 fL (ref 80.0–100.0)
Monocytes Absolute: 0.6 10*3/uL (ref 0.1–1.0)
Monocytes Relative: 6 %
Neutro Abs: 6.7 10*3/uL (ref 1.7–7.7)
Neutrophils Relative %: 70 %
Platelets: 245 10*3/uL (ref 150–400)
RBC: 4.88 MIL/uL (ref 3.87–5.11)
RDW: 12.8 % (ref 11.5–15.5)
WBC: 9.7 10*3/uL (ref 4.0–10.5)
nRBC: 0 % (ref 0.0–0.2)

## 2019-04-05 LAB — COMPREHENSIVE METABOLIC PANEL
ALT: 5 U/L (ref 0–44)
AST: 13 U/L — ABNORMAL LOW (ref 15–41)
Albumin: 3.9 g/dL (ref 3.5–5.0)
Alkaline Phosphatase: 120 U/L (ref 38–126)
Anion gap: 12 (ref 5–15)
BUN: 15 mg/dL (ref 8–23)
CO2: 24 mmol/L (ref 22–32)
Calcium: 9.3 mg/dL (ref 8.9–10.3)
Chloride: 102 mmol/L (ref 98–111)
Creatinine, Ser: 0.99 mg/dL (ref 0.44–1.00)
GFR calc Af Amer: >60 mL/min (ref >60)
GFR calc non Af Amer: 56 mL/min — ABNORMAL LOW (ref >60)
Glucose, Bld: 108 mg/dL — ABNORMAL HIGH (ref 70–99)
Potassium: 4.4 mmol/L (ref 3.5–5.1)
Sodium: 138 mmol/L (ref 135–145)
Total Bilirubin: 0.6 mg/dL (ref 0.3–1.2)
Total Protein: 7 g/dL (ref 6.5–8.1)

## 2019-04-05 LAB — URINALYSIS, ROUTINE W REFLEX MICROSCOPIC
Bilirubin Urine: NEGATIVE
Glucose, UA: NEGATIVE mg/dL
Hgb urine dipstick: NEGATIVE
Ketones, ur: 5 mg/dL — AB
Leukocytes,Ua: NEGATIVE
Nitrite: NEGATIVE
Protein, ur: NEGATIVE mg/dL
Specific Gravity, Urine: 1.021 (ref 1.005–1.030)
pH: 6 (ref 5.0–8.0)

## 2019-04-05 MED ORDER — SODIUM CHLORIDE 0.9 % IV BOLUS
1000.0000 mL | Freq: Once | INTRAVENOUS | Status: AC
  Administered 2019-04-05: 16:00:00 1000 mL via INTRAVENOUS

## 2019-04-05 MED ORDER — IOHEXOL 300 MG/ML  SOLN
100.0000 mL | Freq: Once | INTRAMUSCULAR | Status: AC | PRN
  Administered 2019-04-05: 100 mL via INTRAVENOUS

## 2019-04-05 NOTE — Telephone Encounter (Signed)
04/05/19  pt called at 9:14 to cx said she was going to have an emergency CT of her abdomen

## 2019-04-05 NOTE — ED Notes (Signed)
Daughter called.  [place]. If you need another contact person: Son - Sharol Roussel [phone]

## 2019-04-05 NOTE — ED Provider Notes (Signed)
[facility] EMERGENCY DEPARTMENT Provider Note   CSN: [id number] Arrival date & time: 04/05/19  1425    History   Chief Complaint Chief Complaint  Patient presents with  . Abdominal Pain    HPI Deborah Vincent is a 73 y.o. female.     HPI   Patient is seen by me at 3:16 PM.  This time she states she just came here for a CT scan and does not need to be in the emergency department.  States her doctor ordered a stat CT scan to be done as an outpatient.  She states she has abdominal pain for several weeks, with ongoing nausea that she suspects is related to levodopa which she takes for Parkinson's disease.  She describes pain in her left upper abdomen, that is worse with movement, and deep palpation.  She recently had a gallbladder ultrasound done, It was remarkable for an apparent uncomplicated gallbladder stone, in the neck.  She states that she has chronic constipation but had a bowel movement today.  Denies dysuria, fever, chills, cough, shortness of breath, focal weakness or paresthesia.  There are no other known modifying factors.  Past Medical History:  Diagnosis Date  . Breast cancer ([place])   . Depression   . Hyperlipidemia   . Hypertension   . Neuropathy   . Osteoarthritis   . Parkinson's disease [facility])     Patient Active Problem List   Diagnosis Date Noted  . MDD (major depressive disorder), recurrent episode, mild ([place]) 02/05/2019    Past Surgical History:  Procedure Laterality Date  . ABDOMINAL EXPLORATION SURGERY  1999   intestinal adhesions  . ABDOMINAL HYSTERECTOMY  1982  . BREAST LUMPECTOMY Left 04/03/2012  . left mastectomy    . MASTECTOMY Left 04/28/2012  . RENAL ANGIOPLASTY Left 1992     OB History   No obstetric history on file.      Home Medications    Prior to Admission medications   Medication Sig Start Date End Date Taking? Authorizing Provider  acetaminophen (TYLENOL) 325 MG tablet Take 325-650 mg by mouth daily as needed for mild  pain or moderate pain.    Yes [provider]  anastrozole (ARIMIDEX) 1 MG tablet Take 1 mg by mouth daily.   Yes [provider]  aspirin 325 MG tablet Take 325 mg by mouth daily.   Yes [provider]  buPROPion (WELLBUTRIN SR) 100 MG 12 hr tablet Take 100 mg by mouth 2 (two) times daily. 03/08/19  Yes [provider]  buPROPion (WELLBUTRIN) 100 MG tablet Take 1 tablet (100 mg total) by mouth 2 (two) times daily. 03/13/19  Yes Hisada, Elie Goody, MD  carbidopa-levodopa (SINEMET IR) 25-100 MG tablet Take 1 tablet by mouth 5 (five) times daily. 03/13/19  Yes [provider]  diclofenac sodium (VOLTAREN) 1 % GEL Apply topically 4 (four) times daily.   Yes [provider]  docusate sodium (COLACE) 100 MG capsule Take 200 mg by mouth 2 (two) times a day.   Yes [provider]  Lido-Capsaicin-Men-Methyl Sal (MEDI-PATCH-LIDOCAINE EX) Apply 4 % topically daily as needed (for pain).    Yes [provider]  lisinopril (PRINIVIL,ZESTRIL) 5 MG tablet Take 5 mg by mouth daily.   Yes [provider]  MAGNESIUM CL-CALCIUM CARBONATE PO Take 1 tablet by mouth daily.   Yes [provider]  ondansetron (ZOFRAN-ODT) 4 MG disintegrating tablet Take 4 mg by mouth every 8 (eight) hours as needed for nausea or  vomiting.  10/07/18  Yes [provider]  phenylephrine-shark liver oil-mineral oil-petrolatum (PREPARATION H) 0.25-3-14-71.9 % rectal ointment Place 1 application rectally 2 (two) times daily as needed for hemorrhoids.   Yes [provider]  pramipexole (MIRAPEX) 0.125 MG tablet Take 0.375 mg by mouth at bedtime.    Yes [provider]  rasagiline (AZILECT) 1 MG TABS tablet Take 1 mg by mouth daily.   Yes [provider]    Family History Family History  Problem Relation Age of Onset  . Cancer Mother        GI   . Hypertension Mother   . Depression Mother   . Alcohol abuse Mother   . Lung cancer  Father   . Alcohol abuse Father   . Other Sister        Wagner's granuloma    Social History Social History   Tobacco Use  . Smoking status: Never Smoker  . Smokeless tobacco: Never Used  Substance Use Topics  . Alcohol use: No    Frequency: Never    Comment: prior alcohol dependence  . Drug use: Not on file     Allergies   Nubain [nalbuphine hcl], Macrobid [nitrofurantoin macrocrystal], Naproxen, Sulfa antibiotics, and Vicodin [hydrocodone-acetaminophen]   Review of Systems Review of Systems  All other systems reviewed and are negative.    Physical Exam Updated Vital Signs BP (!) 148/89   Pulse 81   Temp 97.9 F (36.6 C) (Oral)   Ht 5\' 2"  (1.575 m)   Wt 86.2 kg   SpO2 92%   BMI 34.75 kg/m   Physical Exam Vitals signs and nursing note reviewed.  Constitutional:      General: She is not in acute distress.    Appearance: She is well-developed. She is obese. She is not ill-appearing, toxic-appearing or diaphoretic.  HENT:     Head: Normocephalic and atraumatic.     Nose: No congestion or rhinorrhea.     Mouth/Throat:     Mouth: Mucous membranes are moist.     Pharynx: No oropharyngeal exudate or posterior oropharyngeal erythema.  Eyes:     Conjunctiva/sclera: Conjunctivae normal.     Pupils: Pupils are equal, round, and reactive to light.  Neck:     Musculoskeletal: Normal range of motion and neck supple.     Trachea: Phonation normal.  Cardiovascular:     Rate and Rhythm: Normal rate and regular rhythm.  Pulmonary:     Effort: Pulmonary effort is normal. No respiratory distress.     Breath sounds: Normal breath sounds. No stridor.  Chest:     Chest wall: No tenderness.  Abdominal:     General: There is no distension.     Palpations: Abdomen is soft. There is no mass.     Tenderness: There is abdominal tenderness (Epigastric and left upper quadrant, mild.). There is no guarding or rebound.     Hernia: No hernia is present.  Musculoskeletal: Normal  range of motion.        General: No swelling or tenderness.  Skin:    General: Skin is warm and dry.  Neurological:     Mental Status: She is alert and oriented to person, place, and time.     Motor: No abnormal muscle tone.  Psychiatric:        Behavior: Behavior normal.        Thought Content: Thought content normal.        Judgment: Judgment normal.  Comments: She appears depressed      ED Treatments / Results  Labs (all labs ordered are listed, but only abnormal results are displayed) Labs Reviewed  COMPREHENSIVE METABOLIC PANEL - Abnormal; Notable for the following components:      Result Value   Glucose, Bld 108 (*)    AST 13 (*)    GFR calc non Af Amer 56 (*)    All other components within normal limits  CBC WITH DIFFERENTIAL/PLATELET - Abnormal; Notable for the following components:   HCT 46.1 (*)    All other components within normal limits  URINALYSIS, ROUTINE W REFLEX MICROSCOPIC - Abnormal; Notable for the following components:   Ketones, ur 5 (*)    All other components within normal limits    EKG None  Radiology Ct Abdomen Pelvis W Contrast  Result Date: 04/05/2019 CLINICAL DATA:  Abdominal pain for weeks.  History of breast cancer. EXAM: CT ABDOMEN AND PELVIS WITH CONTRAST TECHNIQUE: Multidetector CT imaging of the abdomen and pelvis was performed using the standard protocol following bolus administration of intravenous contrast. CONTRAST:  130mL OMNIPAQUE IOHEXOL 300 MG/ML  SOLN COMPARISON:  None. FINDINGS: Lower chest: There is atelectasis versus scarring at the lung bases bilaterally.The heart size is mildly enlarged. Coronary artery calcifications are noted. Hepatobiliary: There is a 2.2 cm hypoattenuating mass in the left hepatic lobe measuring approximately 20 Hounsfield units. There is a punctate hypoattenuating nodule in the right hepatic lobe that is not well evaluated on this exam secondary to its small size. There is cholelithiasis with a gallstone  near the gallbladder neck.There is no biliary ductal dilation. Pancreas: Normal contours without ductal dilatation. No peripancreatic fluid collection. Spleen: No splenic laceration or hematoma. Adrenals/Urinary Tract: --Adrenal glands: Right adrenal gland is unremarkable. There is a 1 point 3 cm left adrenal nodule that is incompletely characterized on this exam. --Right kidney/ureter: There is an extrarenal pelvis without evidence of hydronephrosis. --Left kidney/ureter: There is an extrarenal pelvis without evidence of hydronephrosis. --Urinary bladder: Unremarkable. Stomach/Bowel: --Stomach/Duodenum: There is a small hiatal hernia. --Small bowel: No dilatation or inflammation. --Colon: The colon is underdistended which limits evaluation. There may be some mild wall thickening of the cecum and ascending colon. There is a relative lack of colonic diverticula. --Appendix: Not visualized. No right lower quadrant inflammation or free fluid. Vascular/Lymphatic: Normal course and caliber of the major abdominal vessels. --No retroperitoneal lymphadenopathy. --No mesenteric lymphadenopathy. --No pelvic or inguinal lymphadenopathy. Reproductive: Status post hysterectomy. No adnexal mass. Other: No ascites or free air. The abdominal wall is normal. Musculoskeletal. Multilevel degenerative disc disease and facet arthrosis. No bony spinal canal stenosis. IMPRESSION: 1. No definite acute intra-abdominal abnormality detected. 2. Mild wall thickening of the cecum and ascending colon which is favored to be secondary to underdistention. Correlation with laboratory studies and physical exam is recommended to help exclude a low-grade colitis. 3. Cholelithiasis without CT evidence for acute cholecystitis. There is a large stone near the gallbladder neck. 4. Indeterminate 1.4 cm left adrenal nodule. Given the patient's history of breast cancer, outpatient follow-up is recommended. Consider a nonemergent adrenal mass protocol CT or  MRI for further evaluation or comparison to outside prior studies. 5. Hepatic steatosis. 6.  Aortic Atherosclerosis (ICD10-I70.0). Electronically Signed   By: Constance Holster M.D.   On: 04/05/2019 19:08    Procedures Procedures (including critical care time)  Medications Ordered in ED Medications  sodium chloride 0.9 % bolus 1,000 mL (0 mLs Intravenous Stopped 04/05/19 1710)  iohexol (  OMNIPAQUE) 300 MG/ML solution 100 mL (100 mLs Intravenous Contrast Given 04/05/19 1821)     Initial Impression / Assessment and Plan / ED Course  I have reviewed the triage vital signs and the nursing notes.  Pertinent labs & imaging results that were available during my care of the patient were reviewed by me and considered in my medical decision making (see chart for details).  Clinical Course as of Apr 05 2027  Thu Apr 05, 2019  1936 Normal  Urinalysis, Routine w reflex microscopic(!) [EW]  1937 Normal  CBC with Differential(!) [EW]  1937 Normal except glucose slightly elevated, GFR slightly low  Comprehensive metabolic panel(!) [EW]    Clinical Course User Index [EW] Daleen Bo, MD        Patient Vitals for the past 24 hrs:  BP Temp Temp src Pulse SpO2 Height Weight  04/05/19 1930 (!) 148/89 - - 81 92 % - -  04/05/19 1800 (!) 161/98 - - 89 98 % - -  04/05/19 1730 (!) 156/89 - - 89 97 % - -  04/05/19 1700 (!) 147/90 - - 84 99 % - -  04/05/19 1630 140/87 - - 74 98 % - -  04/05/19 1600 126/73 - - 78 94 % - -  04/05/19 1530 135/74 - - 82 92 % - -  04/05/19 1500 (!) 148/90 - - 85 97 % - -  04/05/19 1454 (!) 144/75 - - 85 97 % - -  04/05/19 1433 - - - - - 5\' 2"  (1.575 m) 86.2 kg  04/05/19 1432 (!) 137/110 97.9 F (36.6 C) Oral (!) 104 98 % - -    7:56 PM Reevaluation with update and discussion. After initial assessment and treatment, an updated evaluation reveals she is comfortable now and states she is not currently hurting.  At this time she states her pain is usually worse after  eating.  Findings discussed with the patient.  She did not want me to talk to any of her family members.  All questions were answered. Daleen Bo   Medical Decision Making: Upper abdominal pain with solitary gallstone, in the neck of the gall bladder.  Gallstones may be symptomatic, and require surgical removal.  There were no current complications seen, similar to when she had her gallbladder imaged with ultrasound recently.  CRITICAL CARE-no Performed by: Daleen Bo    Nursing Notes Reviewed/ Care Coordinated Applicable Imaging Reviewed Interpretation of Laboratory Data incorporated into ED treatment  The patient appears reasonably screened and/or stabilized for discharge and I doubt any other medical condition or other [facility] requiring further screening, evaluation, or treatment in the ED at this time prior to discharge.  Plan: Home Medications-routine medications; Home Treatments-low-fat diet and plenty of water; return here if the recommended treatment, does not improve the symptoms; Recommended follow up-General surgery follow-up as soon as possible to discuss cholecystectomy.  PCP follow-up 1 week for checkup.   Final Clinical Impressions(s) / ED Diagnoses   Final diagnoses:  Left upper quadrant pain  Calculus of gallbladder without cholecystitis without obstruction    ED Discharge Orders    None       Daleen Bo, MD 04/05/19 2028

## 2019-04-05 NOTE — ED Triage Notes (Signed)
Patient complaining of abdominal pain "for weeks" worsening the last 4 days. Also complaining of vomiting.

## 2019-04-05 NOTE — Discharge Instructions (Addendum)
You have a large gallstone that may be causing the pain.  To avoid complications of the gallbladder stone, try to stay on a low-fat diet and drink plenty of water daily.  Call the surgeon, Dr. Constance Haw for a follow-up appointment to be seen regarding the gallstone.  Also follow-up with your primary care doctor for checkup in a week or so.  You can return here if your pain becomes unbearable, or you have other concerns.

## 2019-04-09 ENCOUNTER — Telehealth: Payer: Self-pay | Admitting: Neurology

## 2019-04-09 NOTE — Telephone Encounter (Signed)
See below. Please advise.  

## 2019-04-09 NOTE — Telephone Encounter (Signed)
New Message  Pt verbalized went to ER for abdominal pain and dry heat.    Pt verbalized ER MD referred patient to General Surgeon due to "Left upper quadrant pain, Calculus of gallbladder without cholecystitis without obstruction and hardly able to drink water."   Pt is wanting to know if Dr. Carles Collet knows of a General Surgeon who handles Parkinsons Disease.  Please f/u with pt

## 2019-04-09 NOTE — Telephone Encounter (Signed)
Called spoke with patient she was informed of this and understands

## 2019-04-09 NOTE — Telephone Encounter (Signed)
Any general surgeon can do this?  General surgeons don't specialize in parkinsons disease.

## 2019-04-10 ENCOUNTER — Ambulatory Visit (HOSPITAL_COMMUNITY): Payer: Medicare Other | Admitting: Physical Therapy

## 2019-04-11 ENCOUNTER — Telehealth (HOSPITAL_COMMUNITY): Payer: Self-pay | Admitting: Internal Medicine

## 2019-04-11 ENCOUNTER — Ambulatory Visit (HOSPITAL_COMMUNITY): Payer: Medicare Other

## 2019-04-11 NOTE — Telephone Encounter (Signed)
04/11/19  pt called to cx for now ... said that she was really sick and will having surgery just not sure when ... I told her we would discharge her and if she still needed therapy we would just have to get a new order and she said that was fine.

## 2019-04-12 ENCOUNTER — Ambulatory Visit (HOSPITAL_COMMUNITY): Payer: Medicare Other | Admitting: Speech Pathology

## 2019-04-12 ENCOUNTER — Other Ambulatory Visit: Payer: Self-pay

## 2019-04-12 ENCOUNTER — Ambulatory Visit: Payer: Medicare Other | Admitting: General Surgery

## 2019-04-12 ENCOUNTER — Encounter: Payer: Self-pay | Admitting: General Surgery

## 2019-04-12 VITALS — HR 99 | Temp 97.1°F | Resp 18 | Ht 61.0 in | Wt 166.0 lb

## 2019-04-12 DIAGNOSIS — K8020 Calculus of gallbladder without cholecystitis without obstruction: Secondary | ICD-10-CM | POA: Diagnosis not present

## 2019-04-12 NOTE — Patient Instructions (Signed)

## 2019-04-13 ENCOUNTER — Telehealth: Payer: Self-pay

## 2019-04-13 NOTE — Telephone Encounter (Signed)
Pt called was seen in E.D. on 04/05/19 also seen Dr. Arnoldo Morale general surgery on yesterday.  Pt c/o nausea, dry heaves, fatigue x 10 days Denies constipation  Current medication:  carbidopa-levodopa 25/100 5 times a day 6AM/10AM/2PM/6PM/10PM  Pramipexole 0.125 MG at bedtime  Pt would like to know if she needs to reduce or change medications.   She says other provider recommended that she contact neurology about medication that could be causing her to feel this way they were unable to find anything to cause her symptoms.

## 2019-04-13 NOTE — Progress Notes (Signed)
Deborah Vincent; [id number]; [birth year]   HPI Patient is a 73 year old white female who was referred to my care for evaluation treatment of gallbladder disease.  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  She was in the emergency room for left upper quadrant abdominal pain.  Ultrasound gallbladder revealed cholelithiasis but no significant edema of the wall or pericholecystic fluid.  Her liver enzyme tests were all within normal limits.  She reports that she has had a 40 pound weight loss for the past 6 months.  She states her appetite is decreased.  She also has states having multiple other issues including urinary and back.  She is sitting in a wheelchair. Past Medical History:  Diagnosis Date  . Breast cancer ([place])   . Depression   . Hyperlipidemia   . Hypertension   . Neuropathy   . Osteoarthritis   . Parkinson's disease [facility])     Past Surgical History:  Procedure Laterality Date  . ABDOMINAL EXPLORATION SURGERY  1999   intestinal adhesions  . ABDOMINAL HYSTERECTOMY  1982  . BREAST LUMPECTOMY Left 04/03/2012  . left mastectomy    . MASTECTOMY Left 04/28/2012  . RENAL ANGIOPLASTY Left 1992    Family History  Problem Relation Age of Onset  . Cancer Mother        GI   . Hypertension Mother   . Depression Mother   . Alcohol abuse Mother   . Lung cancer Father   . Alcohol abuse Father   . Other Sister        Wagner's granuloma    Current Outpatient Medications on File Prior to Visit  Medication Sig Dispense Refill  . acetaminophen (TYLENOL) 325 MG tablet Take 325-650 mg by mouth daily as needed for mild pain or moderate pain.     Marland Kitchen anastrozole (ARIMIDEX) 1 MG tablet Take 1 mg by mouth daily.    Marland Kitchen aspirin 325 MG tablet Take 325 mg by mouth daily.    Marland Kitchen buPROPion (WELLBUTRIN SR) 100 MG 12 hr tablet Take 100 mg by mouth 2 (two) times daily.    Marland Kitchen buPROPion (WELLBUTRIN) 100 MG tablet  Take 1 tablet (100 mg total) by mouth 2 (two) times daily. 180 tablet 0  . carbidopa-levodopa (SINEMET IR) 25-100 MG tablet Take 1 tablet by mouth 5 (five) times daily.    . diclofenac sodium (VOLTAREN) 1 % GEL Apply topically 4 (four) times daily.    Marland Kitchen docusate sodium (COLACE) 100 MG capsule Take 200 mg by mouth 2 (two) times a day.    . Lido-Capsaicin-Men-Methyl Sal (MEDI-PATCH-LIDOCAINE EX) Apply 4 % topically daily as needed (for pain).     Marland Kitchen lisinopril (PRINIVIL,ZESTRIL) 5 MG tablet Take 5 mg by mouth daily.    Marland Kitchen MAGNESIUM CL-CALCIUM CARBONATE PO Take 1 tablet by mouth daily.    . ondansetron (ZOFRAN-ODT) 4 MG disintegrating tablet Take 4 mg by mouth every 8 (eight) hours as needed for nausea or vomiting.     . phenylephrine-shark liver oil-mineral oil-petrolatum (PREPARATION H) 0.25-3-14-71.9 % rectal ointment Place 1 application rectally 2 (two) times daily as needed for hemorrhoids.    . pramipexole (MIRAPEX) 0.125 MG tablet Take 0.375 mg by mouth at bedtime.     . rasagiline (AZILECT) 1 MG TABS tablet Take 1 mg by mouth daily.     No current facility-administered medications on file prior to visit.  Allergies  Allergen Reactions  . Nubain [Nalbuphine Hcl] Anaphylaxis  . Macrobid [Nitrofurantoin Macrocrystal] Nausea Only  . Naproxen Hives  . Sulfa Antibiotics Hives and Itching  . Vicodin [Hydrocodone-Acetaminophen] Other (See Comments)    Overuse in past-prefers not to take    Social History   Substance and Sexual Activity  Alcohol Use No  . Frequency: Never   Comment: prior alcohol dependence    Social History   Tobacco Use  Smoking Status Never Smoker  Smokeless Tobacco Never Used    Review of Systems  Constitutional: Positive for malaise/fatigue.  HENT: Negative.   Eyes: Negative.   Respiratory: Negative.   Cardiovascular: Negative.   Gastrointestinal: Positive for abdominal pain and nausea.  Genitourinary: Negative.   Musculoskeletal: Negative.   Skin:  Negative.   Neurological: Negative.   Endo/Heme/Allergies: Negative.   Psychiatric/Behavioral: Negative.     Objective   Vitals:   04/12/19 1421  Pulse: 99  Resp: 18  Temp: (!) 97.1 F (36.2 C)  SpO2: 95%    Physical Exam Vitals signs reviewed.  Constitutional:      Appearance: Normal appearance. She is normal weight. She is not ill-appearing.  HENT:     Head: Normocephalic and atraumatic.  Eyes:     General: No scleral icterus. Cardiovascular:     Rate and Rhythm: Normal rate and regular rhythm.     Heart sounds: Normal heart sounds. No murmur. No friction rub. No gallop.   Pulmonary:     Effort: Pulmonary effort is normal. No respiratory distress.     Breath sounds: Normal breath sounds. No stridor. No wheezing, rhonchi or rales.  Abdominal:     General: There is no distension.     Palpations: Abdomen is soft. There is no mass.     Tenderness: There is no abdominal tenderness. There is no guarding or rebound.     Hernia: No hernia is present.  Skin:    General: Skin is warm and dry.   Appears comfortable in the wheelchair.  Assessment  Cholelithiasis without evidence of acute cholecystitis at this time.  Patient has multiple comorbidities and is difficult to certain how much of her symptoms are related to biliary colic.  She is at increased risk for any surgical intervention.  I do not feel cholecystectomy is warranted at this time. Plan   Would monitor patient's overall condition.  Should she seem to have more significant abdominal pain or symptoms of acute cholecystitis, admission to the hospital and further work-up would be indicated.

## 2019-04-16 NOTE — Telephone Encounter (Signed)
She and I have talked about this extensively and I have also reviewed numerous notes regarding this.  She came to me in 2018 and reported that higher dose pramipexole had caused nausea, which is why her previous neurologist reduced and she is only on an incredibly small dose now at bedtime.  In 2019, she stated that nausea was caused by wearing off of her levodopa and not by the medication itself.  In 2020, she stated that the medication levodopa caused nausea and we changed it to the extended release because of it.  The last time I saw her in May, 2020, she stated that the nausea was better and we did not change her medication at all.  This makes me think that nausea is not related to her Parkinson's medicines, especially since they have been changed so much over the years.  If I remember right, she had already seen a gastroenterologist in [place].  If she does not have one here, then she needs to see one here.  She can certainly stop the bedtime dose of pramipexole if she thinks that is contributing, as it is likely not helping her very much in terms of a Parkinson's standpoint.

## 2019-04-16 NOTE — Telephone Encounter (Signed)
Called spoke with patient she was aware and understands She will call GI for appt, not seeing GI provider here but will schedule new patient appt

## 2019-04-17 ENCOUNTER — Ambulatory Visit (HOSPITAL_COMMUNITY): Payer: Medicare Other

## 2019-04-18 ENCOUNTER — Ambulatory Visit (HOSPITAL_COMMUNITY): Payer: Medicare Other

## 2019-04-19 ENCOUNTER — Other Ambulatory Visit: Payer: Self-pay

## 2019-04-19 ENCOUNTER — Ambulatory Visit (INDEPENDENT_AMBULATORY_CARE_PROVIDER_SITE_OTHER): Payer: Medicare Other | Admitting: Licensed Clinical Social Worker

## 2019-04-19 DIAGNOSIS — F330 Major depressive disorder, recurrent, mild: Secondary | ICD-10-CM

## 2019-04-20 ENCOUNTER — Other Ambulatory Visit: Payer: Self-pay | Admitting: Physician Assistant

## 2019-04-20 ENCOUNTER — Encounter (HOSPITAL_COMMUNITY): Payer: Self-pay | Admitting: Licensed Clinical Social Worker

## 2019-04-20 DIAGNOSIS — R935 Abnormal findings on diagnostic imaging of other abdominal regions, including retroperitoneum: Secondary | ICD-10-CM

## 2019-04-20 DIAGNOSIS — R112 Nausea with vomiting, unspecified: Secondary | ICD-10-CM

## 2019-04-20 DIAGNOSIS — E278 Other specified disorders of adrenal gland: Secondary | ICD-10-CM

## 2019-04-20 DIAGNOSIS — R1011 Right upper quadrant pain: Secondary | ICD-10-CM

## 2019-04-20 DIAGNOSIS — R160 Hepatomegaly, not elsewhere classified: Secondary | ICD-10-CM

## 2019-04-20 NOTE — Progress Notes (Signed)
Virtual Visit via Video Note  I connected with [name] on 04/20/19 at  3:00 PM EDT by a video enabled telemedicine application and verified that I am speaking with the correct person using two identifiers.  Location: Patient: Home Provider: Office   I discussed the limitations of evaluation and management by telemedicine and the availability of in person appointments. The patient expressed understanding and agreed to proceed.                  THERAPIST PROGRESS NOTE  Session Time: 3:00 pm- 3:45 pm  Participation Level: Active  Behavioral Response: CasualAlertEuthymic  Type of Therapy: Individual Therapy  Treatment Goals addressed: Coping  Interventions: CBT and Solution Focused  Summary: Deborah Vincent is a 73 y.o. female who presents oriented x5 (person, place, situation, time, and object), casually dressed, appropriately groomed, average height, average weight, and cooperative to address mood. Patient has a history of medical treatment including breast cancer, hypertension, osteoarthritis, and parkinson's disease. Patient has a history of mental health treatment including medication management and outpatient therapy. Patient denies suicidal and homicidal ideations. Patient denies psychosis including auditory and visual hallucinations. Patient denies substance abuse. Patient is at low risk for lethality at this time.  Physically: Patient has limited appetite. She has ab pain and severe constipation. She has gone to the doctor about her abdominal pain. She may have to get her gallbladder removed. Patient felt like if it is not her gallbladder then she may not want to get a surgery which could cause constant pain and eventually cause her to pass away (in her mind).  Spiritually/values: Patient is at spiritually at peace. Relationships: Patient is getting along with family but feels like there are some things she needs to make peace with her family relationships.   Emotionally/Mentally/Behavior: Patient's mood is good when she is not in pain. Patient is wondering when people make peace with their body and "let go" versus getting surgeries to prolong life. She worries that any surgery will increase symptoms of parkinson's. Patient is not suicidal but is just making peace with her own mortality.   Patient engaged in session. She responded well to interventions. Patient continues to meet criteria for Major depressive disorder, recurrent episode, mild. Patient will continue in outpatient therapy due to being the least restrictive service to meet her needs. Patient made minimal progress on her goals at this time.   Suicidal/Homicidal: Nowithout intent/plan  Therapist Response: Therapist reviewed patient's recent thoughts and behaviors. Therapist utilized CBT to address mood. Therapist processed patient's thoughts to identify triggers for mood. Therapist discussed with patient whether she wants to have a surgery or just "accept" her body as it is.   Plan: Return again in 3 weeks.  Diagnosis: Axis I: Major depressive disorder, recurrent episode, mild    Axis II: No diagnosis  I discussed the assessment and treatment plan with the patient. The patient was provided an opportunity to ask questions and all were answered. The patient agreed with the plan and demonstrated an understanding of the instructions.   The patient was advised to call back or seek an in-person evaluation if the symptoms worsen or if the condition fails to improve as anticipated.  I provided 45 minutes of non-face-to-face time during this encounter.    Deborah Bickers, LCSW 04/20/2019

## 2019-04-23 ENCOUNTER — Telehealth: Payer: Self-pay | Admitting: Neurology

## 2019-04-23 MED ORDER — ENTACAPONE 200 MG PO TABS: ORAL_TABLET | ORAL | 1 refills | Status: DC

## 2019-04-23 NOTE — Telephone Encounter (Signed)
Add entacapone 200 mg with 6am/10am/2pm dose of carbidopa/levodopa.  It may/will turn urine orange.  You can call in this KG

## 2019-04-23 NOTE — Telephone Encounter (Signed)
Called patient she was made aware of provider response below Rx sent to pharmacy for 30 days with 1 refill

## 2019-04-23 NOTE — Telephone Encounter (Signed)
Called spoke with patient she states that she is having leg spasm in right leg for 2 days now.  Current medication: carbidopa-levodapa 25/100 6AM/10AM/2PM/6PM/10PM some times she takes a extra tablet at 2AM  No longer taken pramipexole

## 2019-04-23 NOTE — Addendum Note (Signed)
Addended by: Ranae Plumber on: 04/23/2019 09:44 AM   Modules accepted: Orders

## 2019-04-23 NOTE — Telephone Encounter (Signed)
New Message  Patient has symptom of right leg cramps and spasms about every half hour when it is time to take carbidopa levodopa 25-100 mg tablet onces five times daily.  Please f/u with patient

## 2019-04-24 ENCOUNTER — Ambulatory Visit (HOSPITAL_COMMUNITY): Payer: Medicare Other | Admitting: Physical Therapy

## 2019-04-25 ENCOUNTER — Ambulatory Visit (HOSPITAL_COMMUNITY): Payer: Medicare Other

## 2019-04-28 ENCOUNTER — Other Ambulatory Visit: Payer: Self-pay

## 2019-04-28 ENCOUNTER — Emergency Department (HOSPITAL_COMMUNITY)
Admission: EM | Admit: 2019-04-28 | Discharge: 2019-04-28 | Disposition: A | Discharge status: Home / Self Care | Payer: Medicare Other | Attending: Emergency Medicine | Admitting: Emergency Medicine

## 2019-04-28 ENCOUNTER — Encounter (HOSPITAL_COMMUNITY): Payer: Self-pay | Admitting: Emergency Medicine

## 2019-04-28 DIAGNOSIS — Z853 Personal history of malignant neoplasm of breast: Secondary | ICD-10-CM | POA: Diagnosis not present

## 2019-04-28 DIAGNOSIS — Z79899 Other long term (current) drug therapy: Secondary | ICD-10-CM | POA: Insufficient documentation

## 2019-04-28 DIAGNOSIS — I10 Essential (primary) hypertension: Secondary | ICD-10-CM | POA: Diagnosis not present

## 2019-04-28 DIAGNOSIS — Z888 Allergy status to other drugs, medicaments and biological substances status: Secondary | ICD-10-CM | POA: Diagnosis not present

## 2019-04-28 DIAGNOSIS — Z882 Allergy status to sulfonamides status: Secondary | ICD-10-CM | POA: Insufficient documentation

## 2019-04-28 DIAGNOSIS — E860 Dehydration: Secondary | ICD-10-CM | POA: Diagnosis present

## 2019-04-28 DIAGNOSIS — R112 Nausea with vomiting, unspecified: Secondary | ICD-10-CM | POA: Diagnosis not present

## 2019-04-28 DIAGNOSIS — Z885 Allergy status to narcotic agent status: Secondary | ICD-10-CM | POA: Insufficient documentation

## 2019-04-28 DIAGNOSIS — G20 Parkinson's disease: Secondary | ICD-10-CM | POA: Insufficient documentation

## 2019-04-28 LAB — COMPREHENSIVE METABOLIC PANEL
ALT: 5 U/L (ref 0–44)
AST: 11 U/L — ABNORMAL LOW (ref 15–41)
Albumin: 3.7 g/dL (ref 3.5–5.0)
Alkaline Phosphatase: 110 U/L (ref 38–126)
Anion gap: 8 (ref 5–15)
BUN: 14 mg/dL (ref 8–23)
CO2: 25 mmol/L (ref 22–32)
Calcium: 9 mg/dL (ref 8.9–10.3)
Chloride: 100 mmol/L (ref 98–111)
Creatinine, Ser: 0.99 mg/dL (ref 0.44–1.00)
GFR calc Af Amer: >60 mL/min (ref >60)
GFR calc non Af Amer: 56 mL/min — ABNORMAL LOW (ref >60)
Glucose, Bld: 103 mg/dL — ABNORMAL HIGH (ref 70–99)
Potassium: 4.3 mmol/L (ref 3.5–5.1)
Sodium: 133 mmol/L — ABNORMAL LOW (ref 135–145)
Total Bilirubin: 0.8 mg/dL (ref 0.3–1.2)
Total Protein: 6.7 g/dL (ref 6.5–8.1)

## 2019-04-28 LAB — CBC WITH DIFFERENTIAL/PLATELET
Abs Immature Granulocytes: 0.03 10*3/uL (ref 0.00–0.07)
Basophils Absolute: 0 10*3/uL (ref 0.0–0.1)
Basophils Relative: 0 %
Eosinophils Absolute: 0.1 10*3/uL (ref 0.0–0.5)
Eosinophils Relative: 1 %
HCT: 44.9 % (ref 36.0–46.0)
Hemoglobin: 14.6 g/dL (ref 12.0–15.0)
Immature Granulocytes: 0 %
Lymphocytes Relative: 20 %
Lymphs Abs: 1.8 10*3/uL (ref 0.7–4.0)
MCH: 29.8 pg (ref 26.0–34.0)
MCHC: 32.5 g/dL (ref 30.0–36.0)
MCV: 91.6 fL (ref 80.0–100.0)
Monocytes Absolute: 0.7 10*3/uL (ref 0.1–1.0)
Monocytes Relative: 7 %
Neutro Abs: 6.3 10*3/uL (ref 1.7–7.7)
Neutrophils Relative %: 72 %
Platelets: 249 10*3/uL (ref 150–400)
RBC: 4.9 MIL/uL (ref 3.87–5.11)
RDW: 12.4 % (ref 11.5–15.5)
WBC: 9 10*3/uL (ref 4.0–10.5)
nRBC: 0 % (ref 0.0–0.2)

## 2019-04-28 LAB — URINALYSIS, ROUTINE W REFLEX MICROSCOPIC
Bilirubin Urine: NEGATIVE
Glucose, UA: NEGATIVE mg/dL
Hgb urine dipstick: NEGATIVE
Ketones, ur: NEGATIVE mg/dL
Leukocytes,Ua: NEGATIVE
Nitrite: NEGATIVE
Protein, ur: NEGATIVE mg/dL
Specific Gravity, Urine: 1.002 — ABNORMAL LOW (ref 1.005–1.030)
pH: 7 (ref 5.0–8.0)

## 2019-04-28 LAB — LIPASE, BLOOD: Lipase: 29 U/L (ref 11–51)

## 2019-04-28 MED ORDER — PROMETHAZINE HCL 25 MG PO TABS: 25.0000 mg | ORAL_TABLET | Freq: Four times a day (QID) | ORAL | 0 refills | Status: DC | PRN

## 2019-04-28 MED ORDER — ONDANSETRON HCL 4 MG/2ML IJ SOLN
4.0000 mg | Freq: Once | INTRAMUSCULAR | Status: AC
  Administered 2019-04-28: 4 mg via INTRAVENOUS
  Filled 2019-04-28: qty 2

## 2019-04-28 MED ORDER — SODIUM CHLORIDE 0.9 % IV BOLUS
1000.0000 mL | Freq: Once | INTRAVENOUS | Status: AC
  Administered 2019-04-28: 1000 mL via INTRAVENOUS

## 2019-04-28 MED ORDER — ONDANSETRON 4 MG PO TBDP: 4.0000 mg | ORAL_TABLET | Freq: Three times a day (TID) | ORAL | 0 refills | Status: DC | PRN

## 2019-04-28 NOTE — ED Provider Notes (Signed)
Patient rechecked prior to discharge.  She is hemodynamically stable.  All of her labs were discussed in detail.  Discharge medication Phenergan 25 mg.   Nat Christen, MD 04/28/19 [phone]

## 2019-04-28 NOTE — ED Provider Notes (Signed)
[facility] EMERGENCY DEPARTMENT Provider Note   CSN: [id number] Arrival date & time: 04/28/19  0456     History   Chief Complaint Chief Complaint  Patient presents with  . Dehydration    HPI Deborah Vincent is a 73 y.o. female.     Patient presents to the emergency department for evaluation of abdominal, dry heaves, inability to eat and drink.  Patient reports that symptoms have been ongoing for weeks.  She has been seen in the ER previously and is currently being worked up by her gastroenterologist.  She reports that she has an MRI pending.  She feels weak and dehydrated tonight.  Pain is intermittent in the center of her upper abdomen.  She reports that it occurs when she eats.  She feels it as a sharp and crampy pain.     Past Medical History:  Diagnosis Date  . Breast cancer ([place])   . Depression   . Hyperlipidemia   . Hypertension   . Neuropathy   . Osteoarthritis   . Parkinson's disease [facility])     Patient Active Problem List   Diagnosis Date Noted  . MDD (major depressive disorder), recurrent episode, mild ([place]) 02/05/2019    Past Surgical History:  Procedure Laterality Date  . ABDOMINAL EXPLORATION SURGERY  1999   intestinal adhesions  . ABDOMINAL HYSTERECTOMY  1982  . BREAST LUMPECTOMY Left 04/03/2012  . left mastectomy    . MASTECTOMY Left 04/28/2012  . RENAL ANGIOPLASTY Left 1992     OB History   No obstetric history on file.      Home Medications    Prior to Admission medications   Medication Sig Start Date End Date Taking? Authorizing Provider  acetaminophen (TYLENOL) 325 MG tablet Take 325-650 mg by mouth daily as needed for mild pain or moderate pain.     [provider]  anastrozole (ARIMIDEX) 1 MG tablet Take 1 mg by mouth daily.    [provider]  aspirin 325 MG tablet Take 325 mg by mouth daily.    [provider]  buPROPion (WELLBUTRIN SR) 100 MG 12 hr tablet Take 100 mg by mouth 2 (two) times daily.  03/08/19   [provider]  buPROPion (WELLBUTRIN) 100 MG tablet Take 1 tablet (100 mg total) by mouth 2 (two) times daily. 03/13/19   Norman Clay, MD  carbidopa-levodopa (SINEMET IR) 25-100 MG tablet Take 1 tablet by mouth 5 (five) times daily. 03/13/19   [provider]  diclofenac sodium (VOLTAREN) 1 % GEL Apply topically 4 (four) times daily.    [provider]  docusate sodium (COLACE) 100 MG capsule Take 200 mg by mouth 2 (two) times a day.    [provider]  entacapone (COMTAN) 200 MG tablet Take one tablet 3 times a day at 6AM/10AM/2PM 04/23/19   Tat, Rebecca S, DO  Lido-Capsaicin-Men-Methyl Sal (MEDI-PATCH-LIDOCAINE EX) Apply 4 % topically daily as needed (for pain).     [provider]  lisinopril (PRINIVIL,ZESTRIL) 5 MG tablet Take 5 mg by mouth daily.    [provider]  MAGNESIUM CL-CALCIUM CARBONATE PO Take 1 tablet by mouth daily.    [provider]  ondansetron (ZOFRAN-ODT) 4 MG disintegrating tablet Take 4 mg by mouth every 8 (eight) hours as needed for nausea or vomiting.  10/07/18   [provider]  phenylephrine-shark liver oil-mineral oil-petrolatum (PREPARATION H) 0.25-3-14-71.9 % rectal ointment Place 1 application rectally 2 (two) times daily as needed for hemorrhoids.  [provider]  pramipexole (MIRAPEX) 0.125 MG tablet Take 0.375 mg by mouth at bedtime.     [provider]  rasagiline (AZILECT) 1 MG TABS tablet Take 1 mg by mouth daily.    [provider]    Family History Family History  Problem Relation Age of Onset  . Cancer Mother        GI   . Hypertension Mother   . Depression Mother   . Alcohol abuse Mother   . Lung cancer Father   . Alcohol abuse Father   . Other Sister        Wagner's granuloma    Social History Social History   Tobacco Use  . Smoking status: Never Smoker  . Smokeless tobacco: Never Used  Substance Use Topics  . Alcohol use: No     Frequency: Never    Comment: prior alcohol dependence  . Drug use: Not on file     Allergies   Nubain [nalbuphine hcl], Macrobid [nitrofurantoin macrocrystal], Naproxen, Sulfa antibiotics, and Vicodin [hydrocodone-acetaminophen]   Review of Systems Review of Systems  Constitutional: Positive for fatigue.  Gastrointestinal: Positive for abdominal pain, nausea and vomiting.  All other systems reviewed and are negative.    Physical Exam Updated Vital Signs BP (!) 138/92   Pulse 85   Temp 98.5 F (36.9 C) (Oral)   Resp 17   Ht 5\' 1"  (1.549 m)   Wt 75.3 kg   SpO2 95%   BMI 31.37 kg/m   Physical Exam Vitals signs and nursing note reviewed.  Constitutional:      General: She is not in acute distress.    Appearance: Normal appearance. She is well-developed.  HENT:     Head: Normocephalic and atraumatic.     Right Ear: Hearing normal.     Left Ear: Hearing normal.     Nose: Nose normal.  Eyes:     Conjunctiva/sclera: Conjunctivae normal.     Pupils: Pupils are equal, round, and reactive to light.  Neck:     Musculoskeletal: Normal range of motion and neck supple.  Cardiovascular:     Rate and Rhythm: Regular rhythm.     Heart sounds: S1 normal and S2 normal. No murmur. No friction rub. No gallop.   Pulmonary:     Effort: Pulmonary effort is normal. No respiratory distress.     Breath sounds: Normal breath sounds.  Chest:     Chest wall: No tenderness.  Abdominal:     General: Bowel sounds are normal.     Palpations: Abdomen is soft.     Tenderness: There is no abdominal tenderness. There is no guarding or rebound. Negative signs include Murphy's sign and McBurney's sign.     Hernia: No hernia is present.  Musculoskeletal: Normal range of motion.  Skin:    General: Skin is warm and dry.     Findings: No rash.  Neurological:     Mental Status: She is alert and oriented to person, place, and time.     GCS: GCS eye subscore is 4. GCS verbal subscore is 5. GCS motor  subscore is 6.     Cranial Nerves: No cranial nerve deficit.     Sensory: No sensory deficit.     Coordination: Coordination normal.  Psychiatric:        Speech: Speech normal.        Behavior: Behavior normal.        Thought Content: Thought content normal.  ED Treatments / Results  Labs (all labs ordered are listed, but only abnormal results are displayed) Labs Reviewed - No data to display  EKG None  Radiology No results found.  Procedures Procedures (including critical care time)  Medications Ordered in ED Medications - No data to display   Initial Impression / Assessment and Plan / ED Course  I have reviewed the triage vital signs and the nursing notes.  Pertinent labs & imaging results that were available during my care of the patient were reviewed by me and considered in my medical decision making (see chart for details).        Patient presents to the emergency department for evaluation of abdominal pain and weakness.  Patient has had ongoing issues with epigastric pain radiating to the left side for several weeks.  She has been seen in the ER for this, had a CT scan that was normal.  She is followed up with gastroenterology and is currently being worked up for this.  Patient came to the ER today because she has had generalized weakness and feels dehydrated.  No active vomiting but has had some dry heaves and inability to take in oral.  Lab work unremarkable.  Abdominal exam is benign, no tenderness on exam today.  Pain is only intermittent and crampy in nature when it occurs, not currently present.  Patient receiving IV hydration, will sign out to oncoming ER physician.  Anticipate discharge with follow-up with gastroenterology.  Final Clinical Impressions(s) / ED Diagnoses   Final diagnoses:  Intractable vomiting with nausea, unspecified vomiting type    ED Discharge Orders    None       Orpah Greek, MD 04/29/19 0201

## 2019-04-28 NOTE — Discharge Instructions (Addendum)
Your lab tests were all good.  Prescription for nausea medicine E prescribed to your pharmacy.  Follow-up with your doctor.  Copies of tests given.

## 2019-04-28 NOTE — ED Triage Notes (Signed)
Pt c/o n/v, abd pain when eats, dark urine x several weeks
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
## 2019-05-01 ENCOUNTER — Ambulatory Visit (HOSPITAL_COMMUNITY): Payer: Medicare Other

## 2019-05-03 ENCOUNTER — Ambulatory Visit (HOSPITAL_COMMUNITY): Payer: Medicare Other | Admitting: Licensed Clinical Social Worker

## 2019-05-07 ENCOUNTER — Other Ambulatory Visit: Payer: Self-pay

## 2019-05-07 ENCOUNTER — Ambulatory Visit (INDEPENDENT_AMBULATORY_CARE_PROVIDER_SITE_OTHER): Payer: Medicare Other | Admitting: Psychiatry

## 2019-05-07 ENCOUNTER — Encounter (HOSPITAL_COMMUNITY): Payer: Self-pay | Admitting: Psychiatry

## 2019-05-07 DIAGNOSIS — F330 Major depressive disorder, recurrent, mild: Secondary | ICD-10-CM

## 2019-05-07 DIAGNOSIS — F419 Anxiety disorder, unspecified: Secondary | ICD-10-CM

## 2019-05-07 DIAGNOSIS — G4700 Insomnia, unspecified: Secondary | ICD-10-CM

## 2019-05-07 MED ORDER — BUPROPION HCL 100 MG PO TABS: 100.0000 mg | ORAL_TABLET | Freq: Two times a day (BID) | ORAL | 0 refills | Status: DC

## 2019-05-07 NOTE — Patient Instructions (Addendum)
1. Continue bupropion 100 mg twice a day 2. Next appointment: 10/19 at 2 PM  3. Check blood test (thyroid) at the next visit with your provider

## 2019-05-08 ENCOUNTER — Other Ambulatory Visit: Payer: Self-pay

## 2019-05-08 ENCOUNTER — Ambulatory Visit (HOSPITAL_COMMUNITY): Payer: Medicare Other | Admitting: Licensed Clinical Social Worker

## 2019-05-09 ENCOUNTER — Emergency Department (HOSPITAL_COMMUNITY): Payer: Medicare Other

## 2019-05-09 ENCOUNTER — Ambulatory Visit (HOSPITAL_COMMUNITY): Payer: Medicare Other

## 2019-05-09 ENCOUNTER — Encounter (HOSPITAL_COMMUNITY): Payer: Self-pay

## 2019-05-09 ENCOUNTER — Other Ambulatory Visit: Payer: Self-pay

## 2019-05-09 ENCOUNTER — Emergency Department (HOSPITAL_COMMUNITY)
Admission: EM | Admit: 2019-05-09 | Discharge: 2019-05-09 | Disposition: A | Discharge status: Home / Self Care | Payer: Medicare Other | Attending: Emergency Medicine | Admitting: Emergency Medicine

## 2019-05-09 DIAGNOSIS — I10 Essential (primary) hypertension: Secondary | ICD-10-CM | POA: Diagnosis not present

## 2019-05-09 DIAGNOSIS — R1033 Periumbilical pain: Secondary | ICD-10-CM | POA: Diagnosis present

## 2019-05-09 DIAGNOSIS — G20 Parkinson's disease: Secondary | ICD-10-CM | POA: Diagnosis not present

## 2019-05-09 DIAGNOSIS — Z9012 Acquired absence of left breast and nipple: Secondary | ICD-10-CM | POA: Diagnosis not present

## 2019-05-09 DIAGNOSIS — Z853 Personal history of malignant neoplasm of breast: Secondary | ICD-10-CM | POA: Diagnosis not present

## 2019-05-09 DIAGNOSIS — N390 Urinary tract infection, site not specified: Secondary | ICD-10-CM | POA: Insufficient documentation

## 2019-05-09 DIAGNOSIS — Z79899 Other long term (current) drug therapy: Secondary | ICD-10-CM | POA: Diagnosis not present

## 2019-05-09 DIAGNOSIS — Z7982 Long term (current) use of aspirin: Secondary | ICD-10-CM | POA: Insufficient documentation

## 2019-05-09 LAB — CBC
HCT: 44.3 % (ref 36.0–46.0)
Hemoglobin: 14.3 g/dL (ref 12.0–15.0)
MCH: 29.9 pg (ref 26.0–34.0)
MCHC: 32.3 g/dL (ref 30.0–36.0)
MCV: 92.7 fL (ref 80.0–100.0)
Platelets: 280 10*3/uL (ref 150–400)
RBC: 4.78 MIL/uL (ref 3.87–5.11)
RDW: 12.7 % (ref 11.5–15.5)
WBC: 9.7 10*3/uL (ref 4.0–10.5)
nRBC: 0 % (ref 0.0–0.2)

## 2019-05-09 LAB — COMPREHENSIVE METABOLIC PANEL
ALT: 5 U/L (ref 0–44)
AST: 17 U/L (ref 15–41)
Albumin: 3.6 g/dL (ref 3.5–5.0)
Alkaline Phosphatase: 110 U/L (ref 38–126)
Anion gap: 12 (ref 5–15)
BUN: 13 mg/dL (ref 8–23)
CO2: 22 mmol/L (ref 22–32)
Calcium: 9.2 mg/dL (ref 8.9–10.3)
Chloride: 103 mmol/L (ref 98–111)
Creatinine, Ser: 0.98 mg/dL (ref 0.44–1.00)
GFR calc Af Amer: >60 mL/min (ref >60)
GFR calc non Af Amer: 57 mL/min — ABNORMAL LOW (ref >60)
Glucose, Bld: 98 mg/dL (ref 70–99)
Potassium: 4.2 mmol/L (ref 3.5–5.1)
Sodium: 137 mmol/L (ref 135–145)
Total Bilirubin: 0.6 mg/dL (ref 0.3–1.2)
Total Protein: 6.6 g/dL (ref 6.5–8.1)

## 2019-05-09 LAB — URINALYSIS, ROUTINE W REFLEX MICROSCOPIC
Bilirubin Urine: NEGATIVE
Glucose, UA: NEGATIVE mg/dL
Hgb urine dipstick: NEGATIVE
Ketones, ur: 5 mg/dL — AB
Nitrite: POSITIVE — AB
Protein, ur: NEGATIVE mg/dL
Specific Gravity, Urine: 1.012 (ref 1.005–1.030)
WBC, UA: >50 WBC/hpf — ABNORMAL HIGH (ref 0–5)
pH: 5 (ref 5.0–8.0)

## 2019-05-09 LAB — LIPASE, BLOOD: Lipase: 30 U/L (ref 11–51)

## 2019-05-09 LAB — LACTIC ACID, PLASMA: Lactic Acid, Venous: 1 mmol/L (ref 0.5–1.9)

## 2019-05-09 MED ORDER — IOHEXOL 350 MG/ML SOLN
100.0000 mL | Freq: Once | INTRAVENOUS | Status: AC | PRN
  Administered 2019-05-09: 100 mL via INTRAVENOUS

## 2019-05-09 MED ORDER — PROCHLORPERAZINE MALEATE 5 MG PO TABS: 5.0000 mg | ORAL_TABLET | Freq: Two times a day (BID) | ORAL | 0 refills | Status: DC | PRN

## 2019-05-09 MED ORDER — PROCHLORPERAZINE EDISYLATE 10 MG/2ML IJ SOLN
5.0000 mg | Freq: Once | INTRAMUSCULAR | Status: AC
  Administered 2019-05-09: 5 mg via INTRAVENOUS
  Filled 2019-05-09: qty 2

## 2019-05-09 MED ORDER — DIPHENHYDRAMINE HCL 50 MG/ML IJ SOLN
12.5000 mg | Freq: Once | INTRAMUSCULAR | Status: AC
  Administered 2019-05-09: 12.5 mg via INTRAVENOUS
  Filled 2019-05-09: qty 1

## 2019-05-09 MED ORDER — SODIUM CHLORIDE 0.9 % IV BOLUS
500.0000 mL | Freq: Once | INTRAVENOUS | Status: AC
  Administered 2019-05-09: 500 mL via INTRAVENOUS

## 2019-05-09 MED ORDER — SODIUM CHLORIDE 0.9% FLUSH
3.0000 mL | Freq: Once | INTRAVENOUS | Status: AC
  Administered 2019-05-09: 3 mL via INTRAVENOUS

## 2019-05-09 MED ORDER — CEPHALEXIN 500 MG PO CAPS: 500.0000 mg | ORAL_CAPSULE | Freq: Four times a day (QID) | ORAL | 0 refills | Status: AC

## 2019-05-09 MED ORDER — ONDANSETRON HCL 4 MG/2ML IJ SOLN
4.0000 mg | Freq: Once | INTRAMUSCULAR | Status: AC
  Administered 2019-05-09: 4 mg via INTRAVENOUS
  Filled 2019-05-09: qty 2

## 2019-05-09 MED ORDER — CEPHALEXIN 250 MG PO CAPS
500.0000 mg | ORAL_CAPSULE | Freq: Once | ORAL | Status: AC
  Administered 2019-05-09: 22:00:00 500 mg via ORAL
  Filled 2019-05-09: qty 2

## 2019-05-09 MED ORDER — FAMOTIDINE IN NACL 20-0.9 MG/50ML-% IV SOLN
20.0000 mg | Freq: Once | INTRAVENOUS | Status: AC
  Administered 2019-05-09: 20 mg via INTRAVENOUS
  Filled 2019-05-09: qty 50

## 2019-05-09 NOTE — ED Triage Notes (Signed)
Pt has nausea and abd pain for weeks.  Is seen by [facility] for same.  Pt called them today and was referred to ED.   Nausea and abd pain worse x 48 hours.   Has only been able to drink ensure, pedialyte, and pudding.   Pt uses fleets enema qd d/t parkinsons.  Last BM 2 days ago, not normal.

## 2019-05-09 NOTE — Discharge Instructions (Addendum)
The medications that you were given today that made you feel better were Pepcid, Benadryl, and Compazine.  If your symptoms worsen or you have any concerns please seek additional medical care and evaluation.  Please schedule a follow-up appointment with your primary care doctor, and your GI doctor.  Compazine (nausea medication) can worsen Parkinson's symptoms so please only use it if needed.  Compazine can also make you feel drowsy and more likely to fall so please be very careful.   You may have diarrhea from the antibiotics.  It is very important that you continue to take the antibiotics even if you get diarrhea unless a medical professional tells you that you may stop taking them.  If you stop too early the bacteria you are being treated for will become stronger and you may need different, more powerful antibiotics that have more side effects and worsening diarrhea.  Please stay well hydrated and consider probiotics as they may decrease the severity of your diarrhea.   Today you received medications that may make you sleepy or impair your ability to make decisions.  For the next 24 hours please do not drive, operate heavy machinery, care for a small child with out another adult present, or perform any activities that may cause harm to you or someone else if you were to fall asleep or be impaired.   You are being prescribed a medication which may make you sleepy. Please follow up of listed precautions for at least 24 hours after taking one dose.

## 2019-05-09 NOTE — ED Notes (Signed)
Pt took home dose of Carbidopa-Levadopa 25 100mg  and Entacapone 200mg 

## 2019-05-09 NOTE — ED Notes (Signed)
ED Provider at bedside. 

## 2019-05-09 NOTE — ED Provider Notes (Signed)
[facility] EMERGENCY DEPARTMENT Provider Note   CSN: [id number] Arrival date & time: 05/09/19  1349    History   Chief Complaint Chief Complaint  Patient presents with  . Abdominal Pain  . Nausea    HPI Deborah Vincent is a 73 y.o. female with past medical history of Parkinson's disease, renal angioplasty, hypertension, hyperlipidemia, neuropathy, depression, left-sided mastectomy, status post abdominal hysterectomy with exploratory surgery for intestinal adhesions, who presents today for evaluation of acute on chronic abdominal pain.  She reports that for many months she has been having periumbilical abdominal pain.  Chart review shows that when she was seen on 7/2 she reported at that time it had been many weeks of abdominal pain.  She has been seen in the emergency room multiple times for this.  She reports nausea and worsening pain anytime she eats.  Over the past 2 days her pain has become more left sided in addition to her periumbilical pain.  She is able to drink Pedialyte however reports a significant weight loss.  She reports that she has been taking Zofran, last dose at about 8 AM.  She reports nausea and asked to drink some water.  She denies any trauma.  Chart review shows that she has been seen by general surgery for cholelithiasis and they did not feel that her symptoms were primarily from that.  There shows that she has had a abdominal ultrasound on 6/30 and a CT scan on 7/2.  She has orders for an MR abdomen placed however has not gotten those yet.       HPI  Past Medical History:  Diagnosis Date  . Breast cancer ([place])   . Depression   . Hyperlipidemia   . Hypertension   . Neuropathy   . Osteoarthritis   . Parkinson's disease [facility])     Patient Active Problem List   Diagnosis Date Noted  . MDD (major depressive disorder), recurrent episode, mild ([place]) 02/05/2019    Past Surgical History:  Procedure Laterality Date  . ABDOMINAL  EXPLORATION SURGERY  1999   intestinal adhesions  . ABDOMINAL HYSTERECTOMY  1982  . BREAST LUMPECTOMY Left 04/03/2012  . left mastectomy    . MASTECTOMY Left 04/28/2012  . RENAL ANGIOPLASTY Left 1992     OB History   No obstetric history on file.      Home Medications    Prior to Admission medications   Medication Sig Start Date End Date Taking? Authorizing Provider  acetaminophen (TYLENOL) 325 MG tablet Take 325-650 mg by mouth daily as needed for mild pain or moderate pain.     [provider]  anastrozole (ARIMIDEX) 1 MG tablet Take 1 mg by mouth daily.    [provider]  aspirin 325 MG tablet Take 325 mg by mouth daily.    [provider]  buPROPion (WELLBUTRIN SR) 100 MG 12 hr tablet Take 100 mg by mouth 2 (two) times daily. 03/08/19   [provider]  buPROPion (WELLBUTRIN) 100 MG tablet Take 1 tablet (100 mg total) by mouth 2 (two) times daily. 06/12/19   Norman Clay, MD  carbidopa-levodopa (SINEMET IR) 25-100 MG tablet Take 1 tablet by mouth 5 (five) times daily. 03/13/19   [provider]  cephALEXin (KEFLEX) 500 MG capsule Take 1 capsule (500 mg total) by mouth 4 (four) times daily for 10 days. 05/09/19 05/19/19  Lorin Glass, PA-C  diclofenac sodium (VOLTAREN) 1 % GEL Apply topically 4 (four) times  daily.    [provider]  docusate sodium (COLACE) 100 MG capsule Take 200 mg by mouth 2 (two) times a day.    [provider]  entacapone (COMTAN) 200 MG tablet Take one tablet 3 times a day at 6AM/10AM/2PM 04/23/19   Tat, Rebecca S, DO  Lido-Capsaicin-Men-Methyl Sal (MEDI-PATCH-LIDOCAINE EX) Apply 4 % topically daily as needed (for pain).     [provider]  lisinopril (PRINIVIL,ZESTRIL) 5 MG tablet Take 5 mg by mouth daily.    [provider]  MAGNESIUM CL-CALCIUM CARBONATE PO Take 1 tablet by mouth daily.    [provider]  phenylephrine-shark liver oil-mineral oil-petrolatum  (PREPARATION H) 0.25-3-14-71.9 % rectal ointment Place 1 application rectally 2 (two) times daily as needed for hemorrhoids.    [provider]  pramipexole (MIRAPEX) 0.25 MG tablet Take 0.25 mg by mouth at bedtime.    [provider]  prochlorperazine (COMPAZINE) 5 MG tablet Take 1 tablet (5 mg total) by mouth 2 (two) times daily as needed for nausea or vomiting. 05/09/19   Lorin Glass, PA-C  promethazine (PHENERGAN) 25 MG tablet Take 1 tablet (25 mg total) by mouth every 6 (six) hours as needed. 04/28/19   Nat Christen, MD  rasagiline (AZILECT) 1 MG TABS tablet Take 1 mg by mouth daily.    [provider]    Family History Family History  Problem Relation Age of Onset  . Cancer Mother        GI   . Hypertension Mother   . Depression Mother   . Alcohol abuse Mother   . Lung cancer Father   . Alcohol abuse Father   . Other Sister        Wagner's granuloma    Social History Social History   Tobacco Use  . Smoking status: Never Smoker  . Smokeless tobacco: Never Used  Substance Use Topics  . Alcohol use: No    Frequency: Never    Comment: prior alcohol dependence  . Drug use: Not on file     Allergies   Nubain [nalbuphine hcl], Macrobid [nitrofurantoin macrocrystal], Naproxen, Sulfa antibiotics, and Vicodin [hydrocodone-acetaminophen]   Review of Systems Review of Systems  HENT: Negative for congestion.   Respiratory: Negative for chest tightness and shortness of breath.   Cardiovascular: Negative for chest pain.  Gastrointestinal: Positive for abdominal pain. Negative for constipation, nausea and vomiting.  All other systems reviewed and are negative.    Physical Exam Updated Vital Signs BP 127/77   Pulse 87   Temp 98.3 F (36.8 C)   Resp 20   SpO2 97%   Physical Exam Vitals signs and nursing note reviewed.  Constitutional:      Appearance: She is well-developed. She is not diaphoretic.     Comments: Tearful  HENT:      Head: Normocephalic and atraumatic.     Mouth/Throat:     Mouth: Mucous membranes are moist.  Eyes:     General: No scleral icterus.       Right eye: No discharge.        Left eye: No discharge.     Conjunctiva/sclera: Conjunctivae normal.  Neck:     Musculoskeletal: Normal range of motion.  Cardiovascular:     Rate and Rhythm: Normal rate and regular rhythm.  Pulmonary:     Effort: Pulmonary effort is normal. No respiratory distress.     Breath sounds: No stridor.  Abdominal:     General: Abdomen is  flat. A surgical scar is present. Bowel sounds are increased. There is no distension.     Palpations: Abdomen is soft.     Tenderness: There is abdominal tenderness (Primarily epigastric, also in LUQ/LLQ, RLQ). There is no right CVA tenderness, left CVA tenderness or guarding. Negative signs include Murphy's sign and McBurney's sign.     Hernia: No hernia is present.  Musculoskeletal:        General: No deformity.  Skin:    General: Skin is warm and dry.  Neurological:     Mental Status: She is alert and oriented to person, place, and time.     Motor: No abnormal muscle tone.  Psychiatric:        Mood and Affect: Mood normal. Affect is tearful.        Behavior: Behavior normal.      ED Treatments / Results  Labs (all labs ordered are listed, but only abnormal results are displayed) Labs Reviewed  COMPREHENSIVE METABOLIC PANEL - Abnormal; Notable for the following components:      Result Value   GFR calc non Af Amer 57 (*)    All other components within normal limits  URINALYSIS, ROUTINE W REFLEX MICROSCOPIC - Abnormal; Notable for the following components:   APPearance CLOUDY (*)    Ketones, ur 5 (*)    Nitrite POSITIVE (*)    Leukocytes,Ua LARGE (*)    WBC, UA >50 (*)    Bacteria, UA MANY (*)    Non Squamous Epithelial 0-5 (*)    All other components within normal limits  URINE CULTURE  LIPASE, BLOOD  CBC  LACTIC ACID, PLASMA    EKG EKG  Interpretation  Date/Time:  Wednesday May 09 2019 17:23:23 EDT Ventricular Rate:  78 PR Interval:    QRS Duration: 95 QT Interval:  427 QTC Calculation: 487 R Axis:   -58 Text Interpretation:  Sinus rhythm Abnormal R-wave progression, early transition Inferior infarct, old No old tracing to compare Confirmed by Deno Etienne [phone]) on 05/09/2019 6:10:36 PM   Radiology Ct Angio Abd/pel W And/or Wo Contrast  Result Date: 05/09/2019 CLINICAL DATA:  Chronic nausea and abdominal pain for weeks. Concern for mesenteric ischemia EXAM: CTA ABDOMEN AND PELVIS WITHOUT AND WITH CONTRAST TECHNIQUE: Multidetector CT imaging of the abdomen and pelvis was performed using the standard protocol during bolus administration of intravenous contrast. Multiplanar reconstructed images and MIPs were obtained and reviewed to evaluate the vascular anatomy. CONTRAST:  152mL OMNIPAQUE IOHEXOL 350 MG/ML SOLN COMPARISON:  CT abdomen pelvis April 05, 2019, MR abdomen May 03, 2019 FINDINGS: VASCULAR Aorta: Atherosclerotic plaque within the normal caliber aorta. No dissection, features of vasculitis or significant stenosis. Celiac: Patent without evidence of aneurysm, dissection, vasculitis or significant stenosis. SMA: Patent without evidence of aneurysm, dissection, vasculitis or significant stenosis. Renals: Minimal plaque at the origin of left renal artery without stenosis. Both renal arteries appear patent without aneurysm, dissection, vasculitis, features of fibromuscular dysplasia or significant stenosis. IMA: Atheromatous plaque at the ostium results in at least moderate narrowing. Vessel opacifies normally. Proximal branches opacified. Inflow: Atheromatous plaque within common internal and external without flow-limiting stenosis, aneurysm or dissection. Proximal Outflow: Included portions the common femoral, superficial femoral femoral profundus are widely patent without acute features. Veins: No obvious venous abnormality  within the limitations of this arterial phase study. Review of the MIP images confirms the above findings. NON-VASCULAR Lower chest: Bandlike areas of atelectasis and/or scarring. More dependent atelectasis posteriorly. Atherosclerotic calcification of the coronary  arteries. Normal heart size. No pericardial effusion. Hepatobiliary: No focal liver abnormality is seen. Calcified gallstone near the gallbladder neck. No gallbladder wall thickening, or biliary dilatation. Pancreas: Unremarkable. No pancreatic ductal dilatation or surrounding inflammatory changes. Spleen: Normal in size without focal abnormality. Accessory splenules noted within the splenic hilum and posterior to the spleen. Adrenals/Urinary Tract: 1.3 cm left adrenal nodule characterizes adrenal adenoma on prior MRI of the abdomen. The left adrenal is unremarkable. Bilateral extrarenal pelves. Kidneys are otherwise, without renal calculi, suspicious lesion, or hydronephrosis. Circumferential bladder wall thickening with mild perivesicular haze. Stomach/Bowel: Distal esophagus, stomach and duodenal sweep are unremarkable. No bowel wall thickening or dilatation. No evidence of obstruction. Appendix not clearly identified. No pericecal inflammation. Scattered colonic diverticula without focal pericolonic inflammation to suggest diverticulitis. Lymphatic: No suspicious or enlarged lymph nodes in the included lymphatic chains. Reproductive: Uterus is surgically absent. No concerning adnexal lesions. Other: No abdominopelvic free fluid or free gas. No bowel containing hernias. Small fat containing umbilical hernia. Musculoskeletal: Multilevel degenerative changes are present in the imaged portions of the spine. No acute osseous abnormality or suspicious osseous lesion. IMPRESSION: VASCULAR 1. No evidence of mesenteric arterial occlusive disease. 2.  Aortic Atherosclerosis (ICD10-I70.0). NONVASCULAR 1. Circumferential bladder wall thickening with mild  perivesicular haze, suggestive of cystitis. Correlate with urinalysis. 2. Cholelithiasis. 3. Stable 1.3 cm left adrenal adenoma. Electronically Signed   By: Lovena Le M.D.   On: 05/09/2019 19:08    Procedures Procedures (including critical care time)  Medications Ordered in ED Medications  sodium chloride flush (NS) 0.9 % injection 3 mL (3 mLs Intravenous Given 05/09/19 1626)  ondansetron (ZOFRAN) injection 4 mg (4 mg Intravenous Given 05/09/19 1624)  sodium chloride 0.9 % bolus 500 mL (0 mLs Intravenous Stopped 05/09/19 1959)  iohexol (OMNIPAQUE) 350 MG/ML injection 100 mL (100 mLs Intravenous Contrast Given 05/09/19 1811)  prochlorperazine (COMPAZINE) injection 5 mg (5 mg Intravenous Given 05/09/19 1958)  diphenhydrAMINE (BENADRYL) injection 12.5 mg (12.5 mg Intravenous Given 05/09/19 1959)  famotidine (PEPCID) IVPB 20 mg premix (0 mg Intravenous Stopped 05/09/19 2029)  cephALEXin (KEFLEX) capsule 500 mg (500 mg Oral Given 05/09/19 2226)     Initial Impression / Assessment and Plan / ED Course  I have reviewed the triage vital signs and the nursing notes.  Pertinent labs & imaging results that were available during my care of the patient were reviewed by me and considered in my medical decision making (see chart for details).  Clinical Course as of May 08 2244  Wed May 09, 2019  1802 Over 50 wbc, wbc clumps, suspect UTI, Urine culture sent.   Bacteria, UA(!): MANY [EH]    Clinical Course User Index [EH] Lorin Glass, PA-C      Patient presents today for evaluation of chronic abdominal pain.  Normally it is periumbilical, however she is currently having a little bit of left-sided mid abdominal pain which is new over the past 2 days.  She is also nauseous and dry heaving.  She has previously been seen by GI and her primary care, and is scheduled for an outpatient MRI. On my exam her abdomen is generally tender to exam.  Labs are obtained and reviewed, appear consistent with her  baseline.  Her urine appears infected with many bacteria, over 50 white blood cells, white blood cell clumps.  Even though she is not having dysuria she does have a history of urology surgery on the left ureter/kidney therefore will treat. She previously has had  CT abdomen pelvis with contrast which did not show cause for her pain.  CT angio abdomen pelvis was obtained without evidence for her pain found.  She will be treated with Keflex for her urinary tract infection.  Culture was sent.  She continued to complain of nausea despite Zofran.  She was treated with Compazine, Benadryl, IV fluids, and Pepcid after which her symptoms significantly improved and she requested discharge.  She is given a short dose of Compazine with instructions that this may work in her Parkinson syndrome and make her drowsy and more likely to fall and to use extreme caution.  She is given Keflex for her urinary tract infection.  Recommended follow-up with her primary care doctor and PCP. She does not meet criteria for admission, she is not febrile, tachycardic or tachypnea, she is normotensive, without leukocytosis.  This patient was seen as a shared visit with Dr. Tyrone Nine.  Return precautions were discussed with patient who states their understanding.  At the time of discharge patient denied any unaddressed complaints or concerns.  Patient is agreeable for discharge home.    Final Clinical Impressions(s) / ED Diagnoses   Final diagnoses:  Lower urinary tract infectious disease  Periumbilical abdominal pain    ED Discharge Orders         Ordered    prochlorperazine (COMPAZINE) 5 MG tablet  2 times daily PRN     05/09/19 2148    cephALEXin (KEFLEX) 500 MG capsule  4 times daily     05/09/19 2148           Lorin Glass, Hershal Coria 05/09/19 Whitney, Sargeant, DO 05/09/19 2257

## 2019-05-09 NOTE — ED Notes (Signed)
Pt in CT.

## 2019-05-11 ENCOUNTER — Encounter (HOSPITAL_COMMUNITY): Payer: Self-pay | Admitting: Psychiatry

## 2019-05-11 LAB — URINE CULTURE: Culture: >=100000 — AB

## 2019-05-11 NOTE — Telephone Encounter (Signed)
This encounter was created in error - please disregard.

## 2019-05-12 ENCOUNTER — Telehealth: Payer: Self-pay | Admitting: Emergency Medicine

## 2019-05-12 NOTE — Telephone Encounter (Signed)
Post ED Visit - Positive Culture Follow-up  Culture report reviewed by antimicrobial stewardship pharmacist: [facility] Team []  Elenor Quinones, Pharm.D. []  Heide Guile, Pharm.D., BCPS AQ-ID []  Parks Neptune, Pharm.D., BCPS []  Alycia Rossetti, Pharm.D., BCPS []  [place], Pharm.D., BCPS, AAHIVP []  Legrand Como, Pharm.D., BCPS, AAHIVP []  Salome Arnt, PharmD, BCPS []  Johnnette Gourd, PharmD, BCPS []  Hughes Better, PharmD, BCPS [x]  Gorden Harms, PharmD []  Laqueta Linden, PharmD, BCPS []  Albertina Parr, PharmD  [facility] Team []  Leodis Sias, PharmD []  Lindell Spar, PharmD []  Royetta Asal, PharmD []  Graylin Shiver, Rph []  Rema Fendt) Glennon Mac, PharmD []  Arlyn Dunning, PharmD []  Netta Cedars, PharmD []  Dia Sitter, PharmD []  Leone Haven, PharmD []  Gretta Arab, PharmD []  Theodis Shove, PharmD []  Peggyann Juba, PharmD []  Reuel Boom, PharmD   Positive urine culture Treated with Cephalexin, organism sensitive to the same and no further patient follow-up is required at this time.  Milus Mallick 05/12/2019, 12:33 PM

## 2019-05-15 ENCOUNTER — Other Ambulatory Visit: Payer: Self-pay | Admitting: Neurology

## 2019-05-15 NOTE — Telephone Encounter (Signed)
Requested Prescriptions   Pending Prescriptions Disp Refills  . entacapone (COMTAN) 200 MG tablet [Pharmacy Med Name: ENTACAPONE 200 MG TABLET] 90 tablet 1    Sig: TAKE ONE TABLET 3 TIMES A DAY AT 6AM/10AM/2PM   Rx last filled:04/23/19 #90 1 refill  Pt last seen:02/12/19   Follow up appt scheduled:07/24/19  Denied request to soon

## 2019-05-16 ENCOUNTER — Other Ambulatory Visit: Payer: Medicare Other

## 2019-05-16 ENCOUNTER — Other Ambulatory Visit: Payer: Self-pay | Admitting: Physician Assistant

## 2019-05-16 ENCOUNTER — Other Ambulatory Visit (HOSPITAL_COMMUNITY): Payer: Self-pay | Admitting: Physician Assistant

## 2019-05-16 DIAGNOSIS — R112 Nausea with vomiting, unspecified: Secondary | ICD-10-CM

## 2019-05-16 DIAGNOSIS — R1011 Right upper quadrant pain: Secondary | ICD-10-CM

## 2019-05-22 ENCOUNTER — Other Ambulatory Visit: Payer: Self-pay

## 2019-05-22 NOTE — Telephone Encounter (Signed)
Requested Prescriptions   Pending Prescriptions Disp Refills  . entacapone (COMTAN) 200 MG tablet 270 tablet 1    Sig: Take one tablet 3 times a day at 6AM/10AM/2PM   Rx last filled: 04/23/19 #90 1 refills  Pt last seen:02/12/19   Follow up appt scheduled:07/24/19  DENIED REQUEST TO SOON

## 2019-05-23 ENCOUNTER — Other Ambulatory Visit: Payer: Self-pay

## 2019-05-23 ENCOUNTER — Ambulatory Visit (HOSPITAL_COMMUNITY)
Admission: RE | Admit: 2019-05-23 | Discharge: 2019-05-23 | Disposition: A | Discharge status: Home / Self Care | Payer: Medicare Other | Source: Ambulatory Visit | Attending: Physician Assistant | Admitting: Physician Assistant

## 2019-05-23 ENCOUNTER — Other Ambulatory Visit (HOSPITAL_COMMUNITY): Payer: Self-pay | Admitting: Physician Assistant

## 2019-05-23 DIAGNOSIS — R112 Nausea with vomiting, unspecified: Secondary | ICD-10-CM | POA: Insufficient documentation

## 2019-05-23 DIAGNOSIS — R1084 Generalized abdominal pain: Secondary | ICD-10-CM | POA: Diagnosis present

## 2019-05-24 ENCOUNTER — Ambulatory Visit (HOSPITAL_COMMUNITY)
Admission: RE | Admit: 2019-05-24 | Discharge: 2019-05-24 | Disposition: A | Discharge status: Home / Self Care | Payer: Medicare Other | Source: Ambulatory Visit | Attending: Physician Assistant | Admitting: Physician Assistant

## 2019-05-24 DIAGNOSIS — R112 Nausea with vomiting, unspecified: Secondary | ICD-10-CM | POA: Diagnosis not present

## 2019-05-24 DIAGNOSIS — R1011 Right upper quadrant pain: Secondary | ICD-10-CM | POA: Insufficient documentation

## 2019-05-24 MED ORDER — TECHNETIUM TC 99M MEBROFENIN IV KIT
5.2000 | PACK | Freq: Once | INTRAVENOUS | Status: AC | PRN
  Administered 2019-05-24: 08:00:00 5.2 via INTRAVENOUS

## 2019-05-25 ENCOUNTER — Ambulatory Visit (HOSPITAL_COMMUNITY): Payer: Medicare Other

## 2019-05-25 ENCOUNTER — Encounter (HOSPITAL_COMMUNITY): Payer: Self-pay

## 2019-05-28 ENCOUNTER — Ambulatory Visit: Payer: Self-pay | Admitting: Surgery

## 2019-05-28 NOTE — H&P (View-Only) (Signed)
History of Present Illness Deborah Vincent. Deborah Pelc MD; 05/28/2019 11:51 AM) The patient is a 73 year old female who presents for evaluation of gall stones. PCP- Dr. Delphina Cahill Neuro - Dr. Wells Guiles Tat  This is a 73 year old female with parkinsonism and multiple other medical issues who presents with 9 months of abdominal complaints. The patient reports frequent nausea and abdominal bloating. She also has some pain located in her epigastrium and radiates towards her right side. She does have some abdominal bloating. The patient is frequently constipated but this is a result of her Parkinson's medication. She has not noticed any jaundice. She underwent ultrasound in June of this year and has had multiple other studies. These confirm gallstone. Recent HIDA scan showed a gallbladder ejection fraction of only 36% with recreation of her pain following Ensure ingestion.  CLINICAL DATA: Nausea, vomiting and abdominal pain for 24 hours. History of hysterectomy and renal angioplasty.  EXAM: ABDOMEN ULTRASOUND COMPLETE  COMPARISON: One view abdomen 06/26/2018.  FINDINGS: Gallbladder: Echogenic focus in the gallbladder neck, likely a gallstone. There is no significant gallbladder distention, wall thickening or sonographic Murphy's sign.  Common bile duct: Diameter: 3 mm  Liver: There is a cyst in the left lobe measuring 2.2 x 1.7 x 2.0 cm. No other focal abnormalities are identified. The hepatic echogenicity is diffusely increased, likely due to steatosis. Portal vein is patent on color Doppler imaging with normal direction of blood flow towards the liver.  IVC: No abnormality visualized.  Pancreas: The visualized portions appear unremarkable. Portions of the pancreas are partly obscured by bowel gas.  Spleen: Size and appearance within normal limits.  Right Kidney: Length: 9.3 cm. Echogenicity within normal limits. No mass or hydronephrosis visualized.  Left Kidney: Length: 10.0 cm.  Echogenicity within normal limits. No mass or hydronephrosis visualized.  Abdominal aorta: No aneurysm visualized.  Other findings: None.  IMPRESSION: 1. No acute abdominal findings. 2. Cholelithiasis without evidence of cholecystitis or biliary dilatation. 3. Probable hepatic steatosis.   Electronically Signed By: Deborah Vincent M.D. On: 04/03/2019 13:26   CLINICAL DATA: RIGHT upper quadrant pain with nausea and vomiting for 8 months  EXAM: NUCLEAR MEDICINE HEPATOBILIARY IMAGING WITH GALLBLADDER EF  TECHNIQUE: Sequential images of the abdomen were obtained out to 60 minutes following intravenous administration of radiopharmaceutical. After oral ingestion of Ensure, gallbladder ejection fraction was determined. At 60 min, normal ejection fraction is greater than 33%.  RADIOPHARMACEUTICALS: 5.2 mCi Tc-31m Choletec IV  COMPARISON: None  FINDINGS: Normal tracer extraction from bloodstream indicating normal hepatocellular function.  Normal excretion of tracer into biliary tree.  Gallbladder visualized at 24 min.  Small bowel visualized at 5 min.  No hepatic retention of tracer.  Subjectively normal emptying of tracer from gallbladder following fatty meal stimulation.  Calculated gallbladder ejection fraction is 36%, low normal.  Patient reported abdominal pain following Ensure ingestion.  Normal gallbladder ejection fraction following Ensure ingestion is greater than 33% at 1 hour.  IMPRESSION: Patent biliary tree.  Low normal gallbladder ejection fraction of 36% following Ensure.  Patient reported abdominal pain following Ensure.   Electronically Signed By: Deborah Vincent M.D. On: 05/24/2019 13:21    Problem List/Past Medical Deborah Key K. Mairead Schwarzkopf, MD; 05/28/2019 11:52 AM) CHRONIC CHOLECYSTITIS WITH CALCULUS (K80.10)  Past Surgical History Deborah Vincent, Deborah Vincent; 05/28/2019 10:58 AM) Colon Polyp Removal - Colonoscopy  Allergies Deborah Vincent,  CMA; 05/28/2019 11:03 AM) Nubain *ANALGESICS - OPIOID* Sulfur *CHEMICALS* Naprosyn *ANALGESICS - ANTI-INFLAMMATORY* Allergies Reconciled  Medication History Deborah Key K. Cherylann Hobday, MD;  05/28/2019 11:52 AM) Anastrozole (1MG  Tablet, Oral) Active. buPROPion HCl (100MG  Tablet, Oral) Active. Carbidopa-Levodopa ER (25-100MG  Tablet ER, Oral) Active. Diclofenac Sodium (1% Gel, Transdermal) Active. Lisinopril (5MG  Tablet, Oral) Active. Ondansetron (4MG  Tablet Disint, Oral) Active. Pramipexole Dihydrochloride (0.25MG  Tablet, Oral) Active. Ondansetron HCl (8MG  Tablet, Oral) Active. Aspirin (325MG  Tablet, Oral) Active. Sinemet (25-100MG  Tablet, Oral) Active. Voltaren (1% Gel, Transdermal) Active. Colace (100MG  Capsule, Oral) Active. Medications Reconciled Prochlorperazine Maleate (5MG  Tablet, 1 (one) Oral every six hours, as needed, Taken starting 05/28/2019) Active.  Social History Deborah Vincent, CMA; 05/28/2019 10:58 AM) Alcohol use Moderate alcohol use. Illicit drug use Remotely quit drug use. Tobacco use Never smoker.  Other Problems Deborah Vincent. Deborah Groman, MD; 05/28/2019 11:52 AM) Alcohol Abuse     Review of Systems Deborah Vincent CMA; 05/28/2019 10:58 AM) General Present- Fatigue and Weight Loss. Not Present- Appetite Loss, Chills, Fever, Night Sweats and Weight Gain. Respiratory Not Present- Bloody sputum, Chronic Cough, Difficulty Breathing, Snoring and Wheezing. Gastrointestinal Present- Vomiting. Not Present- Abdominal Pain, Bloating, Bloody Stool, Change in Bowel Habits, Chronic diarrhea, Constipation, Difficulty Swallowing, Excessive gas, Gets full quickly at meals, Hemorrhoids, Indigestion, Nausea and Rectal Pain. Psychiatric Not Present- Anxiety, Bipolar, Change in Sleep Pattern, Depression, Fearful and Frequent crying. Endocrine Not Present- Cold Intolerance, Excessive Hunger, Hair Changes, Heat Intolerance, Hot flashes and New Diabetes.  Vitals Deborah Vincent CMA;  05/28/2019 11:04 AM) 05/28/2019 11:03 AM Weight: 155 lb Height: 62in Weight was reported by patient. Body Surface Area: 1.72 m Body Mass Index: 28.35 kg/m  Temp.: 97.58F(Oral)  Pulse: 89 (Regular)  BP: 132/84 (Sitting, Left Arm, Standard)        Physical Exam Deborah Key K. Luceil Herrin MD; 05/28/2019 11:52 AM)  The physical exam findings are as follows: Note:WDWN in NAD Eyes: Pupils equal, round; sclera anicteric HENT: Oral mucosa moist; good dentition Neck: No masses palpated, no thyromegaly Lungs: CTA bilaterally; normal respiratory effort CV: Regular rate and rhythm; no murmurs; extremities well-perfused with no edema Abd: +bowel sounds, soft, tender in RUQ, no palpable organomegaly; no palpable hernia; long midline incision up to top of umbilicus Skin: Warm, dry; no sign of jaundice Psychiatric - alert and oriented x 4; calm mood and affect    Assessment & Plan Deborah Key K. Aleighya Mcanelly MD; 05/28/2019 11:35 AM)  CHRONIC CHOLECYSTITIS WITH CALCULUS (K80.10)  Current Plans Started Prochlorperazine Maleate 5 MG Oral Tablet, 1 (one) Tablet every six hours, as needed, #30, 05/28/2019, No Refill. Schedule for Surgery - Laparoscopic cholecystectomy with intraoperative cholangiogram. The surgical procedure has been discussed with the patient. Potential risks, benefits, alternative treatments, and expected outcomes have been explained. All of the patient's questions at this time have been answered. The likelihood of reaching the patient's treatment goal is good. The patient understand the proposed surgical procedure and wishes to proceed.  We will probably have to start her epigastrium and work down towards the umbilicus to take down some of the adhesions from her previous laparotomy.  Deborah Vincent. Georgette Dover, MD, [place] [facility] Beeper [phone]  05/28/2019 11:52 AM

## 2019-05-28 NOTE — H&P (Signed)
History of Present Illness Deborah Vincent. Atiya Yera Deborah Vincent; 05/28/2019 11:51 AM) The patient is a 73 year old female who presents for evaluation of gall stones. PCP- Dr. Delphina Cahill Neuro - Dr. Wells Guiles Tat  This is a 73 year old female with parkinsonism and multiple other medical issues who presents with 9 months of abdominal complaints. The patient reports frequent nausea and abdominal bloating. She also has some pain located in her epigastrium and radiates towards her right side. She does have some abdominal bloating. The patient is frequently constipated but this is a result of her Parkinson's medication. She has not noticed any jaundice. She underwent ultrasound in June of this year and has had multiple other studies. These confirm gallstone. Recent HIDA scan showed a gallbladder ejection fraction of only 36% with recreation of her pain following Ensure ingestion.  CLINICAL DATA: Nausea, vomiting and abdominal pain for 24 hours. History of hysterectomy and renal angioplasty.  EXAM: ABDOMEN ULTRASOUND COMPLETE  COMPARISON: One view abdomen 06/26/2018.  FINDINGS: Gallbladder: Echogenic focus in the gallbladder neck, likely a gallstone. There is no significant gallbladder distention, wall thickening or sonographic Murphy's sign.  Common bile duct: Diameter: 3 mm  Liver: There is a cyst in the left lobe measuring 2.2 x 1.7 x 2.0 cm. No other focal abnormalities are identified. The hepatic echogenicity is diffusely increased, likely due to steatosis. Portal vein is patent on color Doppler imaging with normal direction of blood flow towards the liver.  IVC: No abnormality visualized.  Pancreas: The visualized portions appear unremarkable. Portions of the pancreas are partly obscured by bowel gas.  Spleen: Size and appearance within normal limits.  Right Kidney: Length: 9.3 cm. Echogenicity within normal limits. No mass or hydronephrosis visualized.  Left Kidney: Length: 10.0 cm.  Echogenicity within normal limits. No mass or hydronephrosis visualized.  Abdominal aorta: No aneurysm visualized.  Other findings: None.  IMPRESSION: 1. No acute abdominal findings. 2. Cholelithiasis without evidence of cholecystitis or biliary dilatation. 3. Probable hepatic steatosis.   Electronically Signed By: Deborah Vincent M.D. On: 04/03/2019 13:26   CLINICAL DATA: RIGHT upper quadrant pain with nausea and vomiting for 8 months  EXAM: NUCLEAR MEDICINE HEPATOBILIARY IMAGING WITH GALLBLADDER EF  TECHNIQUE: Sequential images of the abdomen were obtained out to 60 minutes following intravenous administration of radiopharmaceutical. After oral ingestion of Ensure, gallbladder ejection fraction was determined. At 60 min, normal ejection fraction is greater than 33%.  RADIOPHARMACEUTICALS: 5.2 mCi Tc-38m Choletec IV  COMPARISON: None  FINDINGS: Normal tracer extraction from bloodstream indicating normal hepatocellular function.  Normal excretion of tracer into biliary tree.  Gallbladder visualized at 24 min.  Small bowel visualized at 5 min.  No hepatic retention of tracer.  Subjectively normal emptying of tracer from gallbladder following fatty meal stimulation.  Calculated gallbladder ejection fraction is 36%, low normal.  Patient reported abdominal pain following Ensure ingestion.  Normal gallbladder ejection fraction following Ensure ingestion is greater than 33% at 1 hour.  IMPRESSION: Patent biliary tree.  Low normal gallbladder ejection fraction of 36% following Ensure.  Patient reported abdominal pain following Ensure.   Electronically Signed By: Deborah Vincent M.D. On: 05/24/2019 13:21    Problem List/Past Medical Deborah Key K. Carly Applegate, Deborah Vincent; 05/28/2019 11:52 AM) CHRONIC CHOLECYSTITIS WITH CALCULUS (K80.10)  Past Surgical History Deborah Vincent, Deborah Vincent; 05/28/2019 10:58 AM) Colon Polyp Removal - Colonoscopy  Allergies Deborah Vincent,  CMA; 05/28/2019 11:03 AM) Nubain *ANALGESICS - OPIOID* Sulfur *CHEMICALS* Naprosyn *ANALGESICS - ANTI-INFLAMMATORY* Allergies Reconciled  Medication History Deborah Key K. Iesha [place], Deborah Vincent;  05/28/2019 11:52 AM) Anastrozole (1MG  Tablet, Oral) Active. buPROPion HCl (100MG  Tablet, Oral) Active. Carbidopa-Levodopa ER (25-100MG  Tablet ER, Oral) Active. Diclofenac Sodium (1% Gel, Transdermal) Active. Lisinopril (5MG  Tablet, Oral) Active. Ondansetron (4MG  Tablet Disint, Oral) Active. Pramipexole Dihydrochloride (0.25MG  Tablet, Oral) Active. Ondansetron HCl (8MG  Tablet, Oral) Active. Aspirin (325MG  Tablet, Oral) Active. Sinemet (25-100MG  Tablet, Oral) Active. Voltaren (1% Gel, Transdermal) Active. Colace (100MG  Capsule, Oral) Active. Medications Reconciled Prochlorperazine Maleate (5MG  Tablet, 1 (one) Oral every six hours, as needed, Taken starting 05/28/2019) Active.  Social History Deborah Vincent, CMA; 05/28/2019 10:58 AM) Alcohol use Moderate alcohol use. Illicit drug use Remotely quit drug use. Tobacco use Never smoker.  Other Problems Deborah Vincent. Deborah [place], Deborah Vincent; 05/28/2019 11:52 AM) Alcohol Abuse     Review of Systems Deborah Vincent CMA; 05/28/2019 10:58 AM) General Present- Fatigue and Weight Loss. Not Present- Appetite Loss, Chills, Fever, Night Sweats and Weight Gain. Respiratory Not Present- Bloody sputum, Chronic Cough, Difficulty Breathing, Snoring and Wheezing. Gastrointestinal Present- Vomiting. Not Present- Abdominal Pain, Bloating, Bloody Stool, Change in Bowel Habits, Chronic diarrhea, Constipation, Difficulty Swallowing, Excessive gas, Gets full quickly at meals, Hemorrhoids, Indigestion, Nausea and Rectal Pain. Psychiatric Not Present- Anxiety, Bipolar, Change in Sleep Pattern, Depression, Fearful and Frequent crying. Endocrine Not Present- Cold Intolerance, Excessive Hunger, Hair Changes, Heat Intolerance, Hot flashes and New Diabetes.  Vitals Deborah Vincent CMA;  05/28/2019 11:04 AM) 05/28/2019 11:03 AM Weight: 155 lb Height: 62in Weight was reported by patient. Body Surface Area: 1.72 m Body Mass Index: 28.35 kg/m  Temp.: 97.60F(Oral)  Pulse: 89 (Regular)  BP: 132/84 (Sitting, Left Arm, Standard)        Physical Exam Deborah Vincent; 05/28/2019 11:52 AM)  The physical exam findings are as follows: Note:WDWN in NAD Eyes: Pupils equal, round; sclera anicteric HENT: Oral mucosa moist; good dentition Neck: No masses palpated, no thyromegaly Lungs: CTA bilaterally; normal respiratory effort CV: Regular rate and rhythm; no murmurs; extremities well-perfused with no edema Abd: +bowel sounds, soft, tender in RUQ, no palpable organomegaly; no palpable hernia; long midline incision up to top of umbilicus Skin: Warm, dry; no sign of jaundice Psychiatric - alert and oriented x 4; calm mood and affect    Assessment & Plan Deborah Key K. Troyce Gieske Deborah Vincent; 05/28/2019 11:35 AM)  CHRONIC CHOLECYSTITIS WITH CALCULUS (K80.10)  Current Plans Started Prochlorperazine Maleate 5 MG Oral Tablet, 1 (one) Tablet every six hours, as needed, #30, 05/28/2019, No Refill. Schedule for Surgery - Laparoscopic cholecystectomy with intraoperative cholangiogram. The surgical procedure has been discussed with the patient. Potential risks, benefits, alternative treatments, and expected outcomes have been explained. All of the patient's questions at this time have been answered. The likelihood of reaching the patient's treatment goal is good. The patient understand the proposed surgical procedure and wishes to proceed.  We will probably have to start her epigastrium and work down towards the umbilicus to take down some of the adhesions from her previous laparotomy.  Deborah Vincent. Georgette Dover, Deborah Vincent, [place] [facility] Beeper [phone]  05/28/2019 11:52 AM

## 2019-06-06 ENCOUNTER — Emergency Department (HOSPITAL_COMMUNITY)
Admission: EM | Admit: 2019-06-06 | Discharge: 2019-06-06 | Discharge status: Against Medical Advice | Payer: Medicare Other | Attending: Emergency Medicine | Admitting: Emergency Medicine

## 2019-06-06 ENCOUNTER — Encounter (HOSPITAL_COMMUNITY): Payer: Self-pay | Admitting: Emergency Medicine

## 2019-06-06 DIAGNOSIS — R109 Unspecified abdominal pain: Secondary | ICD-10-CM | POA: Diagnosis present

## 2019-06-06 DIAGNOSIS — Z5321 Procedure and treatment not carried out due to patient leaving prior to being seen by health care provider: Secondary | ICD-10-CM | POA: Insufficient documentation

## 2019-06-06 LAB — CBC
HCT: 44.9 % (ref 36.0–46.0)
Hemoglobin: 14.5 g/dL (ref 12.0–15.0)
MCH: 30.3 pg (ref 26.0–34.0)
MCHC: 32.3 g/dL (ref 30.0–36.0)
MCV: 93.9 fL (ref 80.0–100.0)
Platelets: 271 10*3/uL (ref 150–400)
RBC: 4.78 MIL/uL (ref 3.87–5.11)
RDW: 12.6 % (ref 11.5–15.5)
WBC: 10.6 10*3/uL — ABNORMAL HIGH (ref 4.0–10.5)
nRBC: 0 % (ref 0.0–0.2)

## 2019-06-06 LAB — COMPREHENSIVE METABOLIC PANEL
ALT: <5 U/L (ref 0–44)
AST: 12 U/L — ABNORMAL LOW (ref 15–41)
Albumin: 3.4 g/dL — ABNORMAL LOW (ref 3.5–5.0)
Alkaline Phosphatase: 98 U/L (ref 38–126)
Anion gap: 13 (ref 5–15)
BUN: 9 mg/dL (ref 8–23)
CO2: 22 mmol/L (ref 22–32)
Calcium: 9.2 mg/dL (ref 8.9–10.3)
Chloride: 99 mmol/L (ref 98–111)
Creatinine, Ser: 0.93 mg/dL (ref 0.44–1.00)
GFR calc Af Amer: >60 mL/min (ref >60)
GFR calc non Af Amer: >60 mL/min (ref >60)
Glucose, Bld: 102 mg/dL — ABNORMAL HIGH (ref 70–99)
Potassium: 4.3 mmol/L (ref 3.5–5.1)
Sodium: 134 mmol/L — ABNORMAL LOW (ref 135–145)
Total Bilirubin: 0.9 mg/dL (ref 0.3–1.2)
Total Protein: 6.6 g/dL (ref 6.5–8.1)

## 2019-06-06 LAB — LIPASE, BLOOD: Lipase: 23 U/L (ref 11–51)

## 2019-06-06 MED ORDER — SODIUM CHLORIDE 0.9% FLUSH: 3.0000 mL | Freq: Once | INTRAVENOUS | Status: DC

## 2019-06-06 NOTE — ED Notes (Signed)
PT did not respond for vitals 1X

## 2019-06-06 NOTE — ED Notes (Signed)
[facility] Primary care-taker please call

## 2019-06-06 NOTE — ED Notes (Signed)
PT refused vitals and stated she is leaving and will call a ride

## 2019-06-06 NOTE — ED Triage Notes (Signed)
Pt arrives to ED with c/o of generalized abd pain with n/v pt has known gallstones and has been seen by general surgery and spoke to there office today and was sent to ED for possible surgery per son.

## 2019-06-07 ENCOUNTER — Telehealth: Payer: Self-pay | Admitting: Neurology

## 2019-06-07 NOTE — Telephone Encounter (Signed)
A representative for the patient called to let Dr. Carles Collet know the patient is having gall bladder surgery at [facility] on 06/13/19 at 8:30.

## 2019-06-07 NOTE — Telephone Encounter (Signed)
noted 

## 2019-06-09 ENCOUNTER — Other Ambulatory Visit (HOSPITAL_COMMUNITY)
Admission: RE | Admit: 2019-06-09 | Discharge: 2019-06-09 | Disposition: A | Discharge status: Home / Self Care | Payer: Medicare Other | Source: Ambulatory Visit | Attending: Surgery | Admitting: Surgery

## 2019-06-09 DIAGNOSIS — Z01812 Encounter for preprocedural laboratory examination: Secondary | ICD-10-CM | POA: Insufficient documentation

## 2019-06-09 DIAGNOSIS — Z20828 Contact with and (suspected) exposure to other viral communicable diseases: Secondary | ICD-10-CM | POA: Insufficient documentation

## 2019-06-10 LAB — NOVEL CORONAVIRUS, NAA (HOSP ORDER, SEND-OUT TO REF LAB; TAT 18-24 HRS): SARS-CoV-2, NAA: NOT DETECTED

## 2019-06-12 ENCOUNTER — Encounter (HOSPITAL_COMMUNITY): Payer: Self-pay | Admitting: *Deleted

## 2019-06-12 MED ORDER — CEFAZOLIN SODIUM-DEXTROSE 2-4 GM/100ML-% IV SOLN
2.0000 g | INTRAVENOUS | Status: AC
  Administered 2019-06-13: 2 g via INTRAVENOUS
  Filled 2019-06-12 (×2): qty 100

## 2019-06-12 NOTE — Progress Notes (Addendum)
Denies chest pain, shob, or cardiology visit. Visitation policy reivewed. Clear liquids until 0530. Clear liquids discussed were Water, Juice (non-citric and without pulp), Carbonated beverages, Clear Tea, Black Coffee only, Plain Jell-O only, Gatorade and Plain Popsicles only.  States she has stopped ASA

## 2019-06-12 NOTE — Anesthesia Preprocedure Evaluation (Addendum)
Anesthesia Evaluation  Patient identified by MRN, date of birth, ID band Patient awake    Reviewed: Allergy & Precautions, H&P , NPO status , Patient's Chart, lab work & pertinent test results  Airway Mallampati: III  TM Distance: >3 FB Neck ROM: Full    Dental no notable dental hx. (+) Teeth Intact, Dental Advisory Given,    Pulmonary neg pulmonary ROS,    Pulmonary exam normal breath sounds clear to auscultation       Cardiovascular Exercise Tolerance: Good hypertension, Pt. on medications  Rhythm:Regular Rate:Normal     Neuro/Psych Depression  Neuromuscular disease    GI/Hepatic negative GI ROS, Neg liver ROS,   Endo/Other  negative endocrine ROS  Renal/GU negative Renal ROS  negative genitourinary   Musculoskeletal  (+) Arthritis , Osteoarthritis,    Abdominal   Peds  Hematology negative hematology ROS (+)   Anesthesia Other Findings   Reproductive/Obstetrics negative OB ROS                           Anesthesia Physical Anesthesia Plan  ASA: III  Anesthesia Plan: General   Post-op Pain Management:    Induction: Intravenous  PONV Risk Score and Plan: 4 or greater and Dexamethasone and Ondansetron  Airway Management Planned: Oral ETT  Additional Equipment:   Intra-op Plan:   Post-operative Plan: Extubation in OR  Informed Consent: I have reviewed the patients History and Physical, chart, labs and discussed the procedure including the risks, benefits and alternatives for the proposed anesthesia with the patient or authorized representative who has indicated his/her understanding and acceptance.   Patient has DNR.  Discussed DNR with patient and Continue DNR.   Dental advisory given  Plan Discussed with: CRNA  Anesthesia Plan Comments:        Anesthesia Quick Evaluation

## 2019-06-13 ENCOUNTER — Encounter (HOSPITAL_COMMUNITY): Payer: Self-pay | Admitting: Surgery

## 2019-06-13 ENCOUNTER — Observation Stay (HOSPITAL_COMMUNITY)
Admission: RE | Admit: 2019-06-13 | Discharge: 2019-06-15 | Disposition: A | Discharge status: Home / Self Care | Payer: Medicare Other | Attending: Surgery | Admitting: Surgery

## 2019-06-13 ENCOUNTER — Other Ambulatory Visit: Payer: Self-pay

## 2019-06-13 ENCOUNTER — Ambulatory Visit (HOSPITAL_COMMUNITY): Payer: Medicare Other | Admitting: Anesthesiology

## 2019-06-13 ENCOUNTER — Encounter (HOSPITAL_COMMUNITY)
Admission: RE | Disposition: A | Discharge status: Home / Self Care | Payer: Self-pay | Source: Home / Self Care | Attending: Surgery

## 2019-06-13 DIAGNOSIS — Z23 Encounter for immunization: Secondary | ICD-10-CM | POA: Insufficient documentation

## 2019-06-13 DIAGNOSIS — Z885 Allergy status to narcotic agent status: Secondary | ICD-10-CM | POA: Diagnosis not present

## 2019-06-13 DIAGNOSIS — Z886 Allergy status to analgesic agent status: Secondary | ICD-10-CM | POA: Diagnosis not present

## 2019-06-13 DIAGNOSIS — Z881 Allergy status to other antibiotic agents status: Secondary | ICD-10-CM | POA: Insufficient documentation

## 2019-06-13 DIAGNOSIS — F1010 Alcohol abuse, uncomplicated: Secondary | ICD-10-CM | POA: Insufficient documentation

## 2019-06-13 DIAGNOSIS — G20 Parkinson's disease: Secondary | ICD-10-CM | POA: Insufficient documentation

## 2019-06-13 DIAGNOSIS — K5900 Constipation, unspecified: Secondary | ICD-10-CM | POA: Insufficient documentation

## 2019-06-13 DIAGNOSIS — K8010 Calculus of gallbladder with chronic cholecystitis without obstruction: Principal | ICD-10-CM | POA: Diagnosis present

## 2019-06-13 DIAGNOSIS — R110 Nausea: Secondary | ICD-10-CM | POA: Diagnosis not present

## 2019-06-13 DIAGNOSIS — R531 Weakness: Secondary | ICD-10-CM | POA: Diagnosis not present

## 2019-06-13 DIAGNOSIS — K660 Peritoneal adhesions (postprocedural) (postinfection): Secondary | ICD-10-CM | POA: Insufficient documentation

## 2019-06-13 DIAGNOSIS — Z882 Allergy status to sulfonamides status: Secondary | ICD-10-CM | POA: Insufficient documentation

## 2019-06-13 HISTORY — PX: LAPAROSCOPIC LYSIS OF ADHESIONS: SHX5905

## 2019-06-13 HISTORY — DX: Urinary tract infection, site not specified: N39.0

## 2019-06-13 HISTORY — PX: CHOLECYSTECTOMY: SHX55

## 2019-06-13 HISTORY — PX: INTRAOPERATIVE CHOLANGIOGRAM: SHX5230

## 2019-06-13 SURGERY — LAPAROSCOPIC CHOLECYSTECTOMY WITH INTRAOPERATIVE CHOLANGIOGRAM
Anesthesia: General | Site: Abdomen

## 2019-06-13 MED ORDER — ONDANSETRON HCL 4 MG/2ML IJ SOLN
INTRAMUSCULAR | Status: DC | PRN
  Administered 2019-06-13: 4 mg via INTRAVENOUS

## 2019-06-13 MED ORDER — ACETAMINOPHEN 650 MG RE SUPP: 650.0000 mg | Freq: Four times a day (QID) | RECTAL | Status: DC | PRN

## 2019-06-13 MED ORDER — RASAGILINE MESYLATE 1 MG PO TABS
1.0000 mg | ORAL_TABLET | Freq: Every day | ORAL | Status: DC
  Administered 2019-06-13 – 2019-06-15 (×3): 1 mg via ORAL
  Filled 2019-06-13 (×3): qty 1

## 2019-06-13 MED ORDER — SODIUM CHLORIDE 0.9 % IV SOLN
INTRAVENOUS | Status: DC
  Administered 2019-06-13 (×2): via INTRAVENOUS

## 2019-06-13 MED ORDER — DOCUSATE SODIUM 100 MG PO CAPS: 200.0000 mg | ORAL_CAPSULE | Freq: Two times a day (BID) | ORAL | Status: DC | PRN

## 2019-06-13 MED ORDER — DIPHENHYDRAMINE HCL 50 MG/ML IJ SOLN: 12.5000 mg | Freq: Four times a day (QID) | INTRAMUSCULAR | Status: DC | PRN

## 2019-06-13 MED ORDER — DEXAMETHASONE SODIUM PHOSPHATE 10 MG/ML IJ SOLN
INTRAMUSCULAR | Status: DC | PRN
  Administered 2019-06-13: 10 mg via INTRAVENOUS

## 2019-06-13 MED ORDER — ACETAMINOPHEN 500 MG PO TABS
1000.0000 mg | ORAL_TABLET | ORAL | Status: AC
  Administered 2019-06-13: 07:00:00 500 mg via ORAL
  Filled 2019-06-13: qty 2

## 2019-06-13 MED ORDER — SUGAMMADEX SODIUM 200 MG/2ML IV SOLN
INTRAVENOUS | Status: DC | PRN
  Administered 2019-06-13: 300 mg via INTRAVENOUS

## 2019-06-13 MED ORDER — KETOROLAC TROMETHAMINE 15 MG/ML IJ SOLN
15.0000 mg | Freq: Four times a day (QID) | INTRAMUSCULAR | Status: DC | PRN
  Administered 2019-06-14 (×2): 15 mg via INTRAVENOUS
  Filled 2019-06-13 (×3): qty 1

## 2019-06-13 MED ORDER — LACTATED RINGERS IV SOLN
INTRAVENOUS | Status: DC | PRN
  Administered 2019-06-13: 08:00:00 via INTRAVENOUS

## 2019-06-13 MED ORDER — DIPHENHYDRAMINE HCL 12.5 MG/5ML PO ELIX: 12.5000 mg | ORAL_SOLUTION | Freq: Four times a day (QID) | ORAL | Status: DC | PRN

## 2019-06-13 MED ORDER — ONDANSETRON HCL 4 MG/2ML IJ SOLN: 4.0000 mg | Freq: Four times a day (QID) | INTRAMUSCULAR | Status: DC | PRN

## 2019-06-13 MED ORDER — PROPOFOL 10 MG/ML IV BOLUS
INTRAVENOUS | Status: DC | PRN
  Administered 2019-06-13: 100 mg via INTRAVENOUS

## 2019-06-13 MED ORDER — FENTANYL CITRATE (PF) 100 MCG/2ML IJ SOLN
INTRAMUSCULAR | Status: AC
  Administered 2019-06-13: 10:00:00 50 ug via INTRAVENOUS
  Filled 2019-06-13: qty 2

## 2019-06-13 MED ORDER — FENTANYL CITRATE (PF) 100 MCG/2ML IJ SOLN
25.0000 ug | INTRAMUSCULAR | Status: DC | PRN
  Administered 2019-06-13 (×2): 50 ug via INTRAVENOUS

## 2019-06-13 MED ORDER — BUPIVACAINE-EPINEPHRINE 0.25% -1:200000 IJ SOLN
INTRAMUSCULAR | Status: DC | PRN
  Administered 2019-06-13: 11 mL

## 2019-06-13 MED ORDER — SUCCINYLCHOLINE CHLORIDE 20 MG/ML IJ SOLN
INTRAMUSCULAR | Status: DC | PRN
  Administered 2019-06-13: 100 mg via INTRAVENOUS

## 2019-06-13 MED ORDER — SODIUM CHLORIDE 0.9 % IR SOLN
Status: DC | PRN
  Administered 2019-06-13: 1

## 2019-06-13 MED ORDER — ACETAMINOPHEN 325 MG PO TABS
650.0000 mg | ORAL_TABLET | Freq: Four times a day (QID) | ORAL | Status: DC | PRN
  Administered 2019-06-14 – 2019-06-15 (×3): 650 mg via ORAL
  Filled 2019-06-13 (×3): qty 2

## 2019-06-13 MED ORDER — GABAPENTIN 300 MG PO CAPS
300.0000 mg | ORAL_CAPSULE | ORAL | Status: AC
  Administered 2019-06-13: 07:00:00 300 mg via ORAL
  Filled 2019-06-13: qty 1

## 2019-06-13 MED ORDER — 0.9 % SODIUM CHLORIDE (POUR BTL) OPTIME
TOPICAL | Status: DC | PRN
  Administered 2019-06-13: 09:00:00 1000 mL

## 2019-06-13 MED ORDER — ROCURONIUM BROMIDE 100 MG/10ML IV SOLN
INTRAVENOUS | Status: DC | PRN
  Administered 2019-06-13: 40 mg via INTRAVENOUS
  Administered 2019-06-13: 10 mg via INTRAVENOUS

## 2019-06-13 MED ORDER — SODIUM CHLORIDE 0.9 % IV SOLN: INTRAVENOUS | Status: DC | PRN

## 2019-06-13 MED ORDER — ENOXAPARIN SODIUM 40 MG/0.4ML ~~LOC~~ SOLN
40.0000 mg | SUBCUTANEOUS | Status: DC
  Administered 2019-06-14 – 2019-06-15 (×2): 40 mg via SUBCUTANEOUS
  Filled 2019-06-13 (×2): qty 0.4

## 2019-06-13 MED ORDER — KETOROLAC TROMETHAMINE 15 MG/ML IJ SOLN
15.0000 mg | Freq: Four times a day (QID) | INTRAMUSCULAR | Status: AC
  Administered 2019-06-13: 10:00:00 15 mg via INTRAVENOUS
  Filled 2019-06-13: qty 1

## 2019-06-13 MED ORDER — TRAMADOL HCL 50 MG PO TABS: 50.0000 mg | ORAL_TABLET | Freq: Four times a day (QID) | ORAL | Status: DC | PRN

## 2019-06-13 MED ORDER — PHENYLEPHRINE 40 MCG/ML (10ML) SYRINGE FOR IV PUSH (FOR BLOOD PRESSURE SUPPORT)
PREFILLED_SYRINGE | INTRAVENOUS | Status: AC
  Filled 2019-06-13: qty 10

## 2019-06-13 MED ORDER — ONDANSETRON 4 MG PO TBDP
4.0000 mg | ORAL_TABLET | Freq: Four times a day (QID) | ORAL | Status: DC | PRN
  Administered 2019-06-15: 12:00:00 4 mg via ORAL
  Filled 2019-06-13: qty 1

## 2019-06-13 MED ORDER — KETOROLAC TROMETHAMINE 15 MG/ML IJ SOLN
INTRAMUSCULAR | Status: AC
  Administered 2019-06-13: 10:00:00 15 mg via INTRAVENOUS
  Filled 2019-06-13: qty 1

## 2019-06-13 MED ORDER — CARBIDOPA-LEVODOPA 25-100 MG PO TABS
1.0000 | ORAL_TABLET | Freq: Every day | ORAL | Status: DC
  Administered 2019-06-13 – 2019-06-15 (×11): 1 via ORAL
  Filled 2019-06-13 (×12): qty 1

## 2019-06-13 MED ORDER — ENTACAPONE 200 MG PO TABS
200.0000 mg | ORAL_TABLET | Freq: Three times a day (TID) | ORAL | Status: DC
  Administered 2019-06-13 – 2019-06-15 (×6): 200 mg via ORAL
  Filled 2019-06-13 (×7): qty 1

## 2019-06-13 MED ORDER — CEFAZOLIN SODIUM-DEXTROSE 2-4 GM/100ML-% IV SOLN
2.0000 g | Freq: Three times a day (TID) | INTRAVENOUS | Status: AC
  Administered 2019-06-13: 17:00:00 2 g via INTRAVENOUS
  Filled 2019-06-13: qty 100

## 2019-06-13 MED ORDER — ANASTROZOLE 1 MG PO TABS
1.0000 mg | ORAL_TABLET | Freq: Every day | ORAL | Status: DC
  Administered 2019-06-13 – 2019-06-14 (×2): 1 mg via ORAL
  Filled 2019-06-13 (×2): qty 1

## 2019-06-13 MED ORDER — LIDOCAINE 2% (20 MG/ML) 5 ML SYRINGE
INTRAMUSCULAR | Status: DC | PRN
  Administered 2019-06-13: 50 mg via INTRAVENOUS
  Administered 2019-06-13: 10 mg via INTRAVENOUS

## 2019-06-13 MED ORDER — PROPOFOL 10 MG/ML IV BOLUS
INTRAVENOUS | Status: AC
  Filled 2019-06-13: qty 20

## 2019-06-13 MED ORDER — LISINOPRIL 5 MG PO TABS
5.0000 mg | ORAL_TABLET | Freq: Every day | ORAL | Status: DC
  Administered 2019-06-13 – 2019-06-15 (×3): 5 mg via ORAL
  Filled 2019-06-13 (×3): qty 1

## 2019-06-13 MED ORDER — BUPROPION HCL 100 MG PO TABS
100.0000 mg | ORAL_TABLET | Freq: Two times a day (BID) | ORAL | Status: DC
  Administered 2019-06-13 – 2019-06-15 (×4): 100 mg via ORAL
  Filled 2019-06-13 (×6): qty 1

## 2019-06-13 MED ORDER — MORPHINE SULFATE (PF) 2 MG/ML IV SOLN: 2.0000 mg | INTRAVENOUS | Status: DC | PRN

## 2019-06-13 MED ORDER — BUPIVACAINE-EPINEPHRINE (PF) 0.25% -1:200000 IJ SOLN
INTRAMUSCULAR | Status: AC
  Filled 2019-06-13: qty 30

## 2019-06-13 MED ORDER — CHLORHEXIDINE GLUCONATE CLOTH 2 % EX PADS: 6.0000 | MEDICATED_PAD | Freq: Once | CUTANEOUS | Status: DC

## 2019-06-13 MED ORDER — FENTANYL CITRATE (PF) 250 MCG/5ML IJ SOLN
INTRAMUSCULAR | Status: AC
  Filled 2019-06-13: qty 5

## 2019-06-13 MED ORDER — FENTANYL CITRATE (PF) 100 MCG/2ML IJ SOLN
INTRAMUSCULAR | Status: DC | PRN
  Administered 2019-06-13 (×4): 50 ug via INTRAVENOUS

## 2019-06-13 SURGICAL SUPPLY — 47 items
APPLIER CLIP ROT 10 11.4 M/L (STAPLE) ×3
BENZOIN TINCTURE PRP APPL 2/3 (GAUZE/BANDAGES/DRESSINGS) ×3 IMPLANT
BLADE CLIPPER SURG (BLADE) IMPLANT
CANISTER SUCT 3000ML PPV (MISCELLANEOUS) ×3 IMPLANT
CHLORAPREP W/TINT 26 (MISCELLANEOUS) ×3 IMPLANT
CLIP APPLIE ROT 10 11.4 M/L (STAPLE) ×1 IMPLANT
CLOSURE WOUND 1/2 X4 (GAUZE/BANDAGES/DRESSINGS) ×1
COVER MAYO STAND STRL (DRAPES) ×3 IMPLANT
COVER SURGICAL LIGHT HANDLE (MISCELLANEOUS) ×3 IMPLANT
COVER WAND RF STERILE (DRAPES) ×3 IMPLANT
DRAPE C-ARM 42X72 X-RAY (DRAPES) ×3 IMPLANT
DRSG TEGADERM 2-3/8X2-3/4 SM (GAUZE/BANDAGES/DRESSINGS) ×9 IMPLANT
DRSG TEGADERM 4X4.75 (GAUZE/BANDAGES/DRESSINGS) ×3 IMPLANT
ELECT REM PT RETURN 9FT ADLT (ELECTROSURGICAL) ×3
ELECTRODE REM PT RTRN 9FT ADLT (ELECTROSURGICAL) ×1 IMPLANT
GAUZE SPONGE 2X2 8PLY STRL LF (GAUZE/BANDAGES/DRESSINGS) ×1 IMPLANT
GLOVE BIO SURGEON STRL SZ7 (GLOVE) ×3 IMPLANT
GLOVE BIO SURGEON STRL SZ7.5 (GLOVE) ×3 IMPLANT
GLOVE BIOGEL PI IND STRL 7.5 (GLOVE) ×2 IMPLANT
GLOVE BIOGEL PI INDICATOR 7.5 (GLOVE) ×4
GOWN STRL REUS W/ TWL LRG LVL3 (GOWN DISPOSABLE) ×3 IMPLANT
GOWN STRL REUS W/TWL LRG LVL3 (GOWN DISPOSABLE) ×6
KIT BASIN OR (CUSTOM PROCEDURE TRAY) ×3 IMPLANT
KIT TURNOVER KIT B (KITS) ×3 IMPLANT
NS IRRIG 1000ML POUR BTL (IV SOLUTION) ×3 IMPLANT
PAD ARMBOARD 7.5X6 YLW CONV (MISCELLANEOUS) ×3 IMPLANT
POUCH RETRIEVAL ECOSAC 10 (ENDOMECHANICALS) IMPLANT
POUCH RETRIEVAL ECOSAC 10MM (ENDOMECHANICALS)
POUCH SPECIMEN RETRIEVAL 10MM (ENDOMECHANICALS) ×3 IMPLANT
SCISSORS LAP 5X35 DISP (ENDOMECHANICALS) ×3 IMPLANT
SET CHOLANGIOGRAPH 5 50 .035 (SET/KITS/TRAYS/PACK) ×3 IMPLANT
SET IRRIG TUBING LAPAROSCOPIC (IRRIGATION / IRRIGATOR) ×3 IMPLANT
SET TUBE SMOKE EVAC HIGH FLOW (TUBING) ×3 IMPLANT
SLEEVE ENDOPATH XCEL 5M (ENDOMECHANICALS) ×3 IMPLANT
SPECIMEN JAR SMALL (MISCELLANEOUS) ×3 IMPLANT
SPONGE GAUZE 2X2 8PLY STER LF (GAUZE/BANDAGES/DRESSINGS) ×1
SPONGE GAUZE 2X2 8PLY STRL LF (GAUZE/BANDAGES/DRESSINGS) ×2 IMPLANT
SPONGE GAUZE 2X2 STER 10/PKG (GAUZE/BANDAGES/DRESSINGS) ×2
STRIP CLOSURE SKIN 1/2X4 (GAUZE/BANDAGES/DRESSINGS) ×2 IMPLANT
SUT MNCRL AB 4-0 PS2 18 (SUTURE) ×3 IMPLANT
TOWEL GREEN STERILE (TOWEL DISPOSABLE) ×3 IMPLANT
TOWEL GREEN STERILE FF (TOWEL DISPOSABLE) ×3 IMPLANT
TRAY LAPAROSCOPIC MC (CUSTOM PROCEDURE TRAY) ×3 IMPLANT
TROCAR XCEL BLUNT TIP 100MML (ENDOMECHANICALS) ×3 IMPLANT
TROCAR XCEL NON-BLD 11X100MML (ENDOMECHANICALS) ×3 IMPLANT
TROCAR XCEL NON-BLD 5MMX100MML (ENDOMECHANICALS) ×3 IMPLANT
WATER STERILE IRR 1000ML POUR (IV SOLUTION) ×3 IMPLANT

## 2019-06-13 NOTE — Op Note (Signed)
Laparoscopic Cholecystectomy Procedure Note  Indications: This is a 73 year old female with parkinsonism and multiple other medical issues who presents with 9 months of abdominal complaints. The patient reports frequent nausea and abdominal bloating. She also has some pain located in her epigastrium and radiates towards her right side. She does have some abdominal bloating. The patient is frequently constipated but this is a result of her Parkinson's medication. She has not noticed any jaundice. She underwent ultrasound in June of this year and has had multiple other studies. These confirm gallstone. Recent HIDA scan showed a gallbladder ejection fraction of only 36% with recreation of her pain following Ensure ingestion.  Pre-operative Diagnosis: Calculus of gallbladder with other cholecystitis, without mention of obstruction  Post-operative Diagnosis: Same  Surgeon: [name]   Assistants: Sharyn Dross RNFA  Anesthesia: General endotracheal anesthesia  ASA Class: 2  Procedure Details  The patient was seen again in the Holding Room. The risks, benefits, complications, treatment options, and expected outcomes were discussed with the patient. The possibilities of reaction to medication, pulmonary aspiration, perforation of viscus, bleeding, recurrent infection, finding a normal gallbladder, the need for additional procedures, failure to diagnose a condition, the possible need to convert to an open procedure, and creating a complication requiring transfusion or operation were discussed with the patient. The likelihood of improving the patient's symptoms with return to their baseline status is good.  The patient and/or family concurred with the proposed plan, giving informed consent. The site of surgery properly noted. The patient was taken to Operating Room, identified as Deborah Vincent and the procedure verified as Laparoscopic Cholecystectomy. A Time Out was held and the above  information confirmed.  Prior to the induction of general anesthesia, antibiotic prophylaxis was administered. General endotracheal anesthesia was then administered and tolerated well. After the induction, the abdomen was prepped with Chloraprep and draped in sterile fashion. The patient was positioned in the supine position.  The patient has a previous long midline incision as well as a left subcostal incision.  I expected some adhesions so I began with a Optiview in the right upper quadrant.  We anesthetized with local anesthetic and made a 5 mm incision.  The Optiview trocar was used to cannulate the peritoneal cavity under direct vision.  We insufflated CO2 maintaining a maximum pressure of 15 mmHg.  The patient has omental adhesions adherent to the midline.  However there was plenty of space in the right upper quadrant.  I placed an 11 mm port in the subxiphoid position and this was inserted under direct vision.  We cut down above the umbilicus and dissected down to the fascia.  We incised the fascia vertically and inserted the Hassan cannula after placing a pursestring suture of 0 Vicryl.  This was all done under direct vision just to the right of the omental adhesions.  An additional 5 mm port was placed in the right upper quadrant. All skin incisions were infiltrated with a local anesthetic agent before making the incision and placing the trocars.   We positioned the patient in reverse Trendelenburg, tilted slightly to the patient's left.  The gallbladder was identified, the fundus grasped and retracted cephalad.  There were some flimsy adhesions to an otherwise normal-appearing gallbladder.  Adhesions were lysed bluntly and with the electrocautery where indicated, taking care not to injure any adjacent organs or viscus. The infundibulum was grasped and retracted laterally, exposing the peritoneum overlying the triangle of Calot. This was then divided and exposed in  a blunt fashion. The cystic duct was  clearly identified and bluntly dissected circumferentially. A critical view of the cystic duct and cystic artery was obtained.  The cystic duct is very diminutive in size so I did not feel that I could insert the cholangiogram catheter.  The cystic duct was then ligated with clips and divided. The cystic artery was dissected free, ligated with clips and divided as well.   The gallbladder was dissected from the liver bed in retrograde fashion with the electrocautery. The gallbladder was removed and placed in an Endocatch sac. The liver bed was irrigated and inspected. Hemostasis was achieved with the electrocautery. Copious irrigation was utilized and was repeatedly aspirated until clear.  The gallbladder and Endocatch sac were then removed through the umbilical port site.  The pursestring suture was used to close the umbilical fascia.    We again inspected the right upper quadrant for hemostasis.  Pneumoperitoneum was released as we removed the trocars.  4-0 Monocryl was used to close the skin.   Benzoin, steri-strips, and clean dressings were applied. The patient was then extubated and brought to the recovery room in stable condition. Instrument, sponge, and needle counts were correct at closure and at the conclusion of the case.   Findings: Cholecystitis with Cholelithiasis  Estimated Blood Loss: Minimal         Drains: none         Specimens: Gallbladder           Complications: None; patient tolerated the procedure well.         Disposition: PACU - hemodynamically stable.         Condition: stable  Imogene Burn. Georgette Dover, MD, [facility] Surgery  General/ Trauma Surgery Beeper [phone]  06/13/2019 9:27 AM

## 2019-06-13 NOTE — Plan of Care (Signed)
  Problem: Activity: Goal: Risk for activity intolerance will decrease Outcome: Progressing   

## 2019-06-13 NOTE — Transfer of Care (Signed)
Immediate Anesthesia Transfer of Care Note  Patient: Deborah Vincent  Procedure(s) Performed: LAPAROSCOPIC CHOLECYSTECTOMY (N/A Abdomen) Attempted Intraoperative Cholangiogram (N/A Abdomen) Laparoscopic Lysis Of Adhesions (N/A Abdomen)  Patient Location: PACU  Anesthesia Type:General  Level of Consciousness: awake, alert  and oriented  Airway & Oxygen Therapy: Patient connected to face mask oxygen  Post-op Assessment: Post -op Vital signs reviewed and stable  Post vital signs: stable  Last Vitals:  Vitals Value Taken Time  BP 167/78 06/13/19 0943  Temp    Pulse 86 06/13/19 0944  Resp 23 06/13/19 0944  SpO2 98 % 06/13/19 0944  Vitals shown include unvalidated device data.  Last Pain:  Vitals:   06/13/19 0708  TempSrc:   PainSc: 4       Patients Stated Pain Goal: 5 ([id number])  Complications: No apparent anesthesia complications

## 2019-06-13 NOTE — Progress Notes (Signed)
Pt arrived to room 6N23 via bed after surgery. Received report from Richardson Landry, [place] in PACU. See assessment. Will continue to monitor.

## 2019-06-13 NOTE — Anesthesia Postprocedure Evaluation (Signed)
Anesthesia Post Note  Patient: Deborah Vincent  Procedure(s) Performed: LAPAROSCOPIC CHOLECYSTECTOMY (N/A Abdomen) Attempted Intraoperative Cholangiogram (N/A Abdomen) Laparoscopic Lysis Of Adhesions (N/A Abdomen)     Patient location during evaluation: PACU Anesthesia Type: General Level of consciousness: awake and alert Pain management: pain level controlled Vital Signs Assessment: post-procedure vital signs reviewed and stable Respiratory status: spontaneous breathing, nonlabored ventilation, respiratory function stable and patient connected to nasal cannula oxygen Cardiovascular status: blood pressure returned to baseline and stable Postop Assessment: no apparent nausea or vomiting Anesthetic complications: no    Last Vitals:  Vitals:   06/13/19 1100 06/13/19 1115  BP: (!) 169/68 (!) 156/71  Pulse: 84 81  Resp: 14 18  Temp:  (!) 36.4 C  SpO2: 100% 100%    Last Pain:  Vitals:   06/13/19 1115  TempSrc:   PainSc: 4                  Zeshan Sena,W. EDMOND

## 2019-06-13 NOTE — Plan of Care (Signed)

## 2019-06-13 NOTE — Anesthesia Procedure Notes (Signed)
Procedure Name: Intubation Date/Time: 06/13/2019 8:35 AM Performed by: Lavell Luster, CRNA Pre-anesthesia Checklist: Patient identified, Emergency Drugs available, Suction available, Patient being monitored and Timeout performed Patient Re-evaluated:Patient Re-evaluated prior to induction Oxygen Delivery Method: Circle system utilized Preoxygenation: Pre-oxygenation with 100% oxygen Induction Type: IV induction Ventilation: Mask ventilation without difficulty Laryngoscope Size: Mac and 4 Grade View: Grade I Tube type: Oral Tube size: 7.0 mm Number of attempts: 1 Airway Equipment and Method: Stylet Placement Confirmation: ETT inserted through vocal cords under direct vision,  positive ETCO2 and breath sounds checked- equal and bilateral Secured at: 22 cm Dental Injury: Teeth and Oropharynx as per pre-operative assessment

## 2019-06-13 NOTE — Interval H&P Note (Signed)
History and Physical Interval Note:  06/13/2019 8:15 AM  Deborah Vincent  has presented today for surgery, with the diagnosis of CHRONIC CALCULUS CHOLECYSTITIS.  The various methods of treatment have been discussed with the patient and family. After consideration of risks, benefits and other options for treatment, the patient has consented to  Procedure(s): LAPAROSCOPIC CHOLECYSTECTOMY WITH INTRAOPERATIVE CHOLANGIOGRAM (N/A) as a surgical intervention.  The patient's history has been reviewed, patient examined, no change in status, stable for surgery.  I have reviewed the patient's chart and labs.  Questions were answered to the patient's satisfaction.     [name]

## 2019-06-14 ENCOUNTER — Encounter (HOSPITAL_COMMUNITY): Payer: Self-pay | Admitting: Surgery

## 2019-06-14 DIAGNOSIS — K8010 Calculus of gallbladder with chronic cholecystitis without obstruction: Secondary | ICD-10-CM | POA: Diagnosis not present

## 2019-06-14 MED ORDER — GUAIFENESIN-DM 100-10 MG/5ML PO SYRP
5.0000 mL | ORAL_SOLUTION | ORAL | Status: DC | PRN
  Administered 2019-06-14 – 2019-06-15 (×2): 5 mL via ORAL
  Filled 2019-06-14 (×2): qty 5

## 2019-06-14 NOTE — Progress Notes (Signed)
Patient had dry heaves and feels weak and is nervous about going home. Had long discussion about options and her concern of very precisely timed levodopa dosing. -plan for am discharge

## 2019-06-14 NOTE — Progress Notes (Signed)
Paged MD on call regarding patient. Patient is experiencing nausea and weakness. Patient states that if she is discharged she will just have to be readmitted. MD notified and orders received. Patient to stay one more night. Will continue to monitor.

## 2019-06-14 NOTE — Discharge Summary (Signed)
Physician Discharge Summary  Patient ID: ESCARLET DEHN MRN: [id number] DOB/AGE: 74/30/1947 73 y.o.  Admit date: 06/13/2019 Discharge date: 06/14/2019  Admission Diagnoses:  Chronic calculus cholecystitis  Discharge Diagnoses: same Active Problems:   Chronic cholecystitis with calculus   Discharged Condition: good  Hospital Course: Lap chole 06/13/19.  Kept overnight due to medical comorbidities.  Patient reports resolution of her nausea.  Tolerating diet.  Pain controlled with tylenol.   Treatments: surgery: lap chole  Discharge Exam: Blood pressure (!) 143/67, pulse 91, temperature 98.4 F (36.9 C), temperature source Oral, resp. rate 19, SpO2 97 %. General appearance: alert, cooperative and no distress GI: soft, incisional tenderness Some bruising around RUQ incisions.  Otherwise c/d/i  Disposition: Discharge disposition: 01-Home or Self Care       Discharge Instructions    Call MD for:  persistant nausea and vomiting   Complete by: As directed    Call MD for:  redness, tenderness, or signs of infection (pain, swelling, redness, odor or green/yellow discharge around incision site)   Complete by: As directed    Call MD for:  severe uncontrolled pain   Complete by: As directed    Call MD for:  temperature >100.4   Complete by: As directed    Diet general   Complete by: As directed    Driving Restrictions   Complete by: As directed    Do not drive while taking pain medications   Increase activity slowly   Complete by: As directed    May shower / Bathe   Complete by: As directed      Allergies as of 06/14/2019      Reactions   Nubain [nalbuphine Hcl] Anaphylaxis   Macrobid [nitrofurantoin Macrocrystal] Nausea Only   Naproxen Hives   Sulfa Antibiotics Hives, Itching   Vicodin [hydrocodone-acetaminophen] Other (See Comments)   Overuse in past-prefers not to take      Medication List    TAKE these medications   acetaminophen 325 MG tablet Commonly known  as: TYLENOL Take 650 mg by mouth daily as needed for mild pain or moderate pain.   anastrozole 1 MG tablet Commonly known as: ARIMIDEX Take 1 mg by mouth at bedtime.   aspirin 325 MG tablet Take 325 mg by mouth daily as needed for moderate pain or headache.   Azilect 1 MG Tabs tablet Generic drug: rasagiline Take 1 mg by mouth daily at 12 noon.   buPROPion 100 MG tablet Commonly known as: WELLBUTRIN Take 1 tablet (100 mg total) by mouth 2 (two) times daily.   carbidopa-levodopa 25-100 MG tablet Commonly known as: SINEMET IR Take 1 tablet by mouth 5 (five) times daily.   cephALEXin 500 MG capsule Commonly known as: KEFLEX Take 500 mg by mouth 4 (four) times daily.   diclofenac sodium 1 % Gel Commonly known as: VOLTAREN Apply 1 application topically 4 (four) times daily as needed (knee pain).   docusate sodium 100 MG capsule Commonly known as: COLACE Take 200 mg by mouth 2 (two) times daily as needed for mild constipation.   entacapone 200 MG tablet Commonly known as: COMTAN Take one tablet 3 times a day at 6AM/10AM/2PM What changed:   how much to take  how to take this  when to take this  additional instructions   Lidocaine 4 % Ptch Apply 1 patch topically 2 (two) times daily as needed (pain).   lisinopril 5 MG tablet Commonly known as: ZESTRIL Take 5 mg by mouth daily.  ondansetron 8 MG tablet Commonly known as: ZOFRAN Take 8 mg by mouth every 8 (eight) hours as needed for nausea or vomiting.   phenylephrine-shark liver oil-mineral oil-petrolatum 0.25-3-14-71.9 % rectal ointment Commonly known as: PREPARATION H Place 1 application rectally 2 (two) times daily as needed for hemorrhoids.   prochlorperazine 5 MG tablet Commonly known as: COMPAZINE Take 1 tablet (5 mg total) by mouth 2 (two) times daily as needed for nausea or vomiting.   promethazine 25 MG tablet Commonly known as: PHENERGAN Take 1 tablet (25 mg total) by mouth every 6 (six) hours as  needed.   Trulance 3 MG Tabs Generic drug: Plecanatide Take 3 mg by mouth daily.      Follow-up Information    Donnie Mesa, MD. Schedule an appointment as soon as possible for a visit in 3 week(s).   Specialty: General Surgery Contact information: [address]  [place] [address] [phone]           Signed: [name] 06/14/2019, 8:21 AM

## 2019-06-14 NOTE — Discharge Instructions (Signed)
[facility] ______[facility] °LAPAROSCOPIC SURGERY: POST OP INSTRUCTIONS °Always review your discharge instruction sheet given to you by the facility where your surgery was performed. °IF YOU HAVE DISABILITY OR FAMILY LEAVE FORMS, YOU MUST BRING THEM TO THE OFFICE FOR PROCESSING.   °DO NOT GIVE THEM TO YOUR DOCTOR. ° °1. A prescription for pain medication may be given to you upon discharge.  Take your pain medication as prescribed, if needed.  If narcotic pain medicine is not needed, then you may take acetaminophen (Tylenol) or ibuprofen (Advil) as needed. °2. Take your usually prescribed medications unless otherwise directed. °3. If you need a refill on your pain medication, please contact your pharmacy.  They will contact our office to request authorization. Prescriptions will not be filled after 5pm or on week-ends. °4. You should follow a light diet the first few days after arrival home, such as soup and crackers, etc.  Be sure to include lots of fluids daily. °5. Most patients will experience some swelling and bruising in the area of the incisions.  Ice packs will help.  Swelling and bruising can take several days to resolve.  °6. It is common to experience some constipation if taking pain medication after surgery.  Increasing fluid intake and taking a stool softener (such as Colace) will usually help or prevent this problem from occurring.  A mild laxative (Milk of Magnesia or Miralax) should be taken according to package instructions if there are no bowel movements after 48 hours. °7. Unless discharge instructions indicate otherwise, you may remove your bandages 24-48 hours after surgery, and you may shower at that time.  You may have steri-strips (small skin tapes) in place directly over the incision.  These strips should be left on the skin for 7-10 days.  If your surgeon used skin glue on the incision, you may shower in 24 hours.  The glue will flake off over the next 2-3 weeks.  Any sutures or  staples will be removed at the office during your follow-up visit. °8. ACTIVITIES:  You may resume regular (light) daily activities beginning the next day--such as daily self-care, walking, climbing stairs--gradually increasing activities as tolerated.  You may have sexual intercourse when it is comfortable.  Refrain from any heavy lifting or straining until approved by your doctor. °a. You may drive when you are no longer taking prescription pain medication, you can comfortably wear a seatbelt, and you can safely maneuver your car and apply brakes. °b. RETURN TO WORK:  __________________________________________________________ °9. You should see your doctor in the office for a follow-up appointment approximately 2-3 weeks after your surgery.  Make sure that you call for this appointment within a day or two after you arrive home to insure a convenient appointment time. °10. OTHER INSTRUCTIONS: __________________________________________________________________________________________________________________________ __________________________________________________________________________________________________________________________ °WHEN TO CALL YOUR DOCTOR: °1. Fever over 101.0 °2. Inability to urinate °3. Continued bleeding from incision. °4. Increased pain, redness, or drainage from the incision. °5. Increasing abdominal pain ° °The clinic staff is available to answer your questions during regular business hours.  Please don’t hesitate to call and ask to speak to one of the nurses for clinical concerns.  If you have a medical emergency, go to the nearest emergency room or call 911.  A surgeon from [facility] is always on call at the hospital. °[address], [address], [place], [place]  [address] ? [address] [address], [place], [place]   [address] °[phone] ? [phone] ? FAX [phone] °Web site:   [email or web address] °

## 2019-06-15 DIAGNOSIS — K8010 Calculus of gallbladder with chronic cholecystitis without obstruction: Secondary | ICD-10-CM | POA: Diagnosis not present

## 2019-06-15 MED ORDER — INFLUENZA VAC A&B SA ADJ QUAD 0.5 ML IM PRSY
0.5000 mL | PREFILLED_SYRINGE | Freq: Once | INTRAMUSCULAR | Status: AC
  Administered 2019-06-15: 13:00:00 0.5 mL via INTRAMUSCULAR
  Filled 2019-06-15: qty 0.5

## 2019-06-15 MED ORDER — INFLUENZA VAC A&B SA ADJ QUAD 0.5 ML IM PRSY
0.5000 mL | PREFILLED_SYRINGE | INTRAMUSCULAR | Status: DC
  Filled 2019-06-15 (×2): qty 0.5

## 2019-06-15 MED ORDER — LORAZEPAM 1 MG PO TABS
1.0000 mg | ORAL_TABLET | Freq: Once | ORAL | Status: AC
  Administered 2019-06-15: 1 mg via ORAL
  Filled 2019-06-15: qty 2

## 2019-06-15 NOTE — Progress Notes (Signed)
2 Days Post-Op   Subjective/Chief Complaint: Feeling better today Stayed overnight because of weakness and mild nausea.  Weakness probably exacerbated by her baseline Parkinsonism  Objective: Vital signs in last 24 hours: Temp:  [97.7 F (36.5 C)-98.2 F (36.8 C)] 98.2 F (36.8 C) (09/11 0451) Pulse Rate:  [79-84] 79 (09/11 0451) Resp:  [16-18] 17 (09/11 0451) BP: (116-140)/(69-74) 140/73 (09/11 0451) SpO2:  [93 %-97 %] 95 % (09/11 0451) Last BM Date: 06/10/19  Intake/Output from previous day: 09/10 0701 - 09/11 0700 In: 240 [P.O.:240] Out: 1400 [Urine:1400] Intake/Output this shift: Total I/O In: 120 [P.O.:120] Out: -   General appearance: alert, cooperative and no distress GI: soft, minimal tenderness Incisions with minimal bruising/ c/d/i  Lab Results:  No results for input(s): WBC, HGB, HCT, PLT in the last 72 hours. BMET No results for input(s): NA, K, CL, CO2, GLUCOSE, BUN, CREATININE, CALCIUM in the last 72 hours. PT/INR No results for input(s): LABPROT, INR in the last 72 hours. ABG No results for input(s): PHART, HCO3 in the last 72 hours.  Invalid input(s): PCO2, PO2  Studies/Results: No results found.  Anti-infectives: Anti-infectives (From admission, onward)   Start     Dose/Rate Route Frequency Ordered Stop   06/13/19 1600  ceFAZolin (ANCEF) IVPB 2g/100 mL premix     2 g 200 mL/hr over 30 Minutes Intravenous Every 8 hours 06/13/19 1158 06/13/19 1723   06/13/19 0730  ceFAZolin (ANCEF) IVPB 2g/100 mL premix     2 g 200 mL/hr over 30 Minutes Intravenous To Short Stay 06/12/19 0817 06/13/19 0905      Assessment/Plan: s/p Procedure(s) with comments: LAPAROSCOPIC CHOLECYSTECTOMY (N/A) Attempted Intraoperative Cholangiogram (N/A) - Attempted unable to perform Laparoscopic Lysis Of Adhesions (N/A) Discharge home today   Deborah Vincent 06/15/2019

## 2019-06-15 NOTE — Plan of Care (Signed)
  Problem: Health Behavior/Discharge Planning: Goal: Ability to manage health-related needs will improve Outcome: Progressing   Problem: Clinical Measurements: Goal: Will remain free from infection Outcome: Progressing   Problem: Activity: Goal: Risk for activity intolerance will decrease Outcome: Progressing   Problem: Nutrition: Goal: Adequate nutrition will be maintained Outcome: Progressing   

## 2019-06-15 NOTE — Progress Notes (Signed)
Patient discharged to home with her brother Rush Landmark at her side. Verbalized understanding of all discharge instructions including incision care, discharge medications and follow up MD visits.

## 2019-06-15 NOTE — Progress Notes (Addendum)
Went into room to deliver patient 10 a.m. medications and patient stated she has taken her Sinemet. Instructed patient she is not to take medications in the hospital that aren't delivered by hospital staff. Patient verbalized understanding and apologized. Will continue to monitor patient for remainder of shift.  At 12:15 patient on toilet with dry heaves, no emesis, reports that this occurs at home regularly. Patient assisted to bed, vital signs stable, Zofran administered per orders. Patient resting in bed at present will continue to monitor.

## 2019-06-29 ENCOUNTER — Telehealth: Payer: Self-pay | Admitting: Neurology

## 2019-06-29 NOTE — Telephone Encounter (Signed)
I sent Dr. Georgette Dover a staff message so I could better understand how I could help.  Pt has an October appt already

## 2019-06-29 NOTE — Telephone Encounter (Signed)
noted 

## 2019-06-29 NOTE — Telephone Encounter (Signed)
Please advise when do you want patient to be moved to?

## 2019-06-29 NOTE — Telephone Encounter (Addendum)
Deborah Vincent from Dr. Vonna Kotyk at [facility] called requesting to move up this patient's follow up appointment from 07/24/2019. Dr. 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  Routing to medical assistant for assistance on moving appointment to sooner. Please advise?  Dr. Vonna Kotyk office requests a call back with the new date/time as well as a call to the patient.

## 2019-06-29 NOTE — Telephone Encounter (Signed)
Got message from Dr. Georgette Dover as well as reviewed notes from him.  Discussed that I do not think her Parkinson's medications are responsible for her constipation.  I have changed her medications around several times, and she continues to complain about constipation and nausea.  She has already been on Linzess.  I had referred her to gastroenterology in the past, but the referral was closed out because she did not answer the telephone when they called to schedule.  It would likely be helpful if the patient would be able to exercise/do physical therapy, but she has been resistant to that in the past.  Constipation is likely from the Parkinson's disease, but I do not think that the nausea likely is, and certainly do not think it is from the medication since I have changed that several times, as did her doctor in [place].   Pt already has October appt with me.  Apple, please let pt know that we could probably get her in for an earlier video visit if she would like to do that.  Up to her.

## 2019-06-30 ENCOUNTER — Other Ambulatory Visit (HOSPITAL_COMMUNITY): Payer: Medicare Other

## 2019-07-02 NOTE — Telephone Encounter (Signed)
Spoke with patient she agrees to VV sent to front desk to schedule

## 2019-07-02 NOTE — Progress Notes (Signed)
Virtual Visit via Phone (tried via video but it failed after many attempts) The purpose of this virtual visit is to provide medical care while limiting exposure to the novel coronavirus.    Consent was obtained for video visit:  Yes.   Answered questions that patient had about telehealth interaction:  Yes.   I discussed the limitations, risks, security and privacy concerns of performing an evaluation and management service by telemedicine. I also discussed with the patient that there may be a patient responsible charge related to this service. The patient expressed understanding and agreed to proceed.  Pt location: Home Physician Location: home Name of referring provider:  [name], MD I connected with Jackelyn Poling Bilyk at patients initiation/request on 07/04/2019 at  8:45 AM EDT by telephone and verified that I am speaking with the correct person using two identifiers. Pt MRN:  [id number] Pt DOB:  1945-12-19 Video Participants:  Jackelyn Poling Fausto;    Current Outpatient Medications on File Prior to Visit  Medication Sig Dispense Refill  . acetaminophen (TYLENOL) 325 MG tablet Take 650 mg by mouth daily as needed for mild pain or moderate pain.     Marland Kitchen buPROPion (WELLBUTRIN) 100 MG tablet Take 1 tablet (100 mg total) by mouth 2 (two) times daily. 180 tablet 0  . carbidopa-levodopa (SINEMET IR) 25-100 MG tablet Take 1 tablet by mouth 5 (five) times daily.    . cephALEXin (KEFLEX) 500 MG capsule Take 500 mg by mouth 4 (four) times daily.    . diclofenac sodium (VOLTAREN) 1 % GEL Apply 1 application topically 4 (four) times daily as needed (knee pain).     Marland Kitchen docusate sodium (COLACE) 100 MG capsule Take 200 mg by mouth 2 (two) times daily as needed for mild constipation.     . entacapone (COMTAN) 200 MG tablet Take one tablet 3 times a day at 6AM/10AM/2PM (Patient taking differently: Take 200 mg by mouth 3 (three) times daily. ) 90 tablet 1  . Lidocaine 4 % PTCH Apply 1 patch topically 2  (two) times daily as needed (pain).    Marland Kitchen lisinopril (PRINIVIL,ZESTRIL) 5 MG tablet Take 5 mg by mouth daily.    . ondansetron (ZOFRAN) 8 MG tablet Take 8 mg by mouth every 8 (eight) hours as needed for nausea or vomiting.    . phenylephrine-shark liver oil-mineral oil-petrolatum (PREPARATION H) 0.25-3-14-71.9 % rectal ointment Place 1 application rectally 2 (two) times daily as needed for hemorrhoids.    . rasagiline (AZILECT) 1 MG TABS tablet Take 1 mg by mouth daily at 12 noon.     . TRULANCE 3 MG TABS Take 3 mg by mouth daily.    Marland Kitchen anastrozole (ARIMIDEX) 1 MG tablet Take 1 mg by mouth at bedtime.      No current facility-administered medications on file prior to visit.      History of Present Illness:  Patient seen today for follow-up of Parkinson's disease.  She is accompanied by her friend and caregiver who supplements the history.  Medical records have been reviewed since our last visit.  Patient was worked in today at the request of Dr. Georgette Dover.  Last visit, I encouraged the patient to take her carbidopa/levodopa 25/100 CR at the times prescribed, which was 1 tablet at 6 AM/10 AM/2 PM/6 PM.  she reports today that she is taking it 5 times per day instead of 4 now.  She is off of pramipexole, 0.25 mg at night for restless leg.  Entacapone,  200 mg 3 times per day was added in July, after she complained about leg spasms.  She isn't taking that.  She states that "I don't take that on a consistent basis."   Last visit, I recommended that she do physical therapy.  She had declined that.  She does tell me she tried but had such nausea she couldn't do it.  Patient has a longstanding history of nausea and constipation which she has attributed to various things.  The first time, she thought it was a side effect of pramipexole.  The second time she thought it was a off phenomenon and thought that levodopa helped it.  Then, she thought that levodopa caused it, which is the reason that she was changed to the  extended release formulation.  Because we had made all these changes, which did not change the nausea, I had referred her to the gastroenterology, but she did not go to that appointment.  She did continue to have nausea, however, and ultimately ended up having her gallbladder taken out recently.  Her nausea did not get better, unfortunately.  Dr. Georgette Dover contacted me because he felt that her Parkinson's medications were what were causing the nausea and constipation, which she was why she was worked in today.  Pt having trouble eating solid foods.  Has had "bladder infection" and "kidney infection."  Her friend states that she has lost almost 50 pounds since December because of her nausea and inability to eat.  Primary care ordered a hospice consult yesterday.  On a positive note, her caregiver does state that her movement symptoms actually seem well controlled.   Observations/Objective:   Vitals:   07/04/19 0802  Weight: 154 lb (69.9 kg)  Height: 5\' 2"  (1.575 m)   Wt Readings from Last 3 Encounters:  07/04/19 154 lb (69.9 kg)  06/15/19 153 lb (69.4 kg)  04/28/19 166 lb (75.3 kg)   Unfortunately, I was not able to examine her today.  She was somewhat tearful on the phone.  Speech was fluent and clear.    Assessment and Plan:   1.  Parkinson's disease.  The patient has tremor, bradykinesia, rigidity and postural instability.             -Discussed that I do not think that the levodopa is causing the nausea, since she had it before we even started her on levodopa (well-documented in my 2018 note).  We discussed that there are other forms of levodopa, but I do not think that this would be useful since the nausea preceded the levodopa.  She is off of all other Parkinson's medications.  -Patient does have entacapone at home and was using it "as needed."  I explained to her how entacapone works, and that it just blocks and enzyme that helps to break down levodopa, thereby extending the life of levodopa.   I told her that patients generally do not take it on an as-needed basis.  I really do not have an objection if she stays off of it, but wanted her to understand how it worked for "leg cramps."  Her caregiver is going to try to use that with the first 3 dosages of levodopa.  -She will continue carbidopa/levodopa 25/100 CR, 1 tablet 5 times per day.   2.  Constipation             -I have referred her to GI on 2 different occasions and she has not gone.  Dr. Georgette Dover has now put in a  new referral.  That appointment is scheduled for today with a equal GI.  I do think that the constipation is likely a consequence of the Parkinson's disease, as opposed to the medications.   3.  Depression              -Feels that she is doing somewhat better.  Has a counseling appointment on May 28.  4.  Nausea             -Long discussion again with the patient.  She had nausea before we ever put her on levodopa.  The first time I saw her, she thought it was a side effect of pramipexole.  The second time I saw the patient, she thought it was an off phenomenon and thought that levodopa helped it.  She later thought that levodopa caused it and she was changed to the extended release formulation.  None of these changes unfortunately have helped her nausea.  I did discuss with her that it could be from bowel dysmotility from PD, but rarely is this profound nausea seen from this and Parkinson's disease, nor is this type of weight loss.  I am glad she is getting evaluated.  -pt reports that she has had so much weight loss that she has now asked for a hospice consult.  I asked her to keep me up-to-date on that.  She did ask me about going back to physical therapy, but I explained to her that this would be against hospice rules if she gets entered into hospice.     Follow Up Instructions:  5 months  -I discussed the assessment and treatment plan with the patient. The patient was provided an opportunity to ask questions and  all were answered. The patient agreed with the plan and demonstrated an understanding of the instructions.   The patient was advised to call back or seek an in-person evaluation if the symptoms worsen or if the condition fails to improve as anticipated.    Total Time spent in visit with the patient was: 28 minutes, of which more than 50% of the time was spent in counseling and/or coordinating care on home safety.  This did not include the time that I attempted to connect her via video.  Pt understands and agrees with the plan of care outlined.     Alonza Bogus, DO

## 2019-07-04 ENCOUNTER — Other Ambulatory Visit: Payer: Self-pay

## 2019-07-04 ENCOUNTER — Encounter: Payer: Self-pay | Admitting: Neurology

## 2019-07-04 ENCOUNTER — Telehealth (INDEPENDENT_AMBULATORY_CARE_PROVIDER_SITE_OTHER): Payer: Medicare Other | Admitting: Neurology

## 2019-07-04 VITALS — Ht 62.0 in | Wt 154.0 lb

## 2019-07-04 DIAGNOSIS — G20 Parkinson's disease: Secondary | ICD-10-CM | POA: Diagnosis not present

## 2019-07-04 DIAGNOSIS — R634 Abnormal weight loss: Secondary | ICD-10-CM | POA: Diagnosis not present

## 2019-07-04 DIAGNOSIS — R110 Nausea: Secondary | ICD-10-CM

## 2019-07-12 ENCOUNTER — Emergency Department (HOSPITAL_COMMUNITY): Payer: Medicare Other

## 2019-07-12 ENCOUNTER — Emergency Department (HOSPITAL_COMMUNITY)
Admission: EM | Admit: 2019-07-12 | Discharge: 2019-07-12 | Disposition: A | Discharge status: Home / Self Care | Payer: Medicare Other | Attending: Emergency Medicine | Admitting: Emergency Medicine

## 2019-07-12 ENCOUNTER — Encounter (HOSPITAL_COMMUNITY): Payer: Self-pay | Admitting: Emergency Medicine

## 2019-07-12 ENCOUNTER — Other Ambulatory Visit: Payer: Self-pay

## 2019-07-12 DIAGNOSIS — S0003XA Contusion of scalp, initial encounter: Secondary | ICD-10-CM | POA: Diagnosis not present

## 2019-07-12 DIAGNOSIS — Z853 Personal history of malignant neoplasm of breast: Secondary | ICD-10-CM | POA: Insufficient documentation

## 2019-07-12 DIAGNOSIS — N390 Urinary tract infection, site not specified: Secondary | ICD-10-CM

## 2019-07-12 DIAGNOSIS — Z79899 Other long term (current) drug therapy: Secondary | ICD-10-CM | POA: Insufficient documentation

## 2019-07-12 DIAGNOSIS — Y92013 Bedroom of single-family (private) house as the place of occurrence of the external cause: Secondary | ICD-10-CM | POA: Insufficient documentation

## 2019-07-12 DIAGNOSIS — W19XXXA Unspecified fall, initial encounter: Secondary | ICD-10-CM | POA: Diagnosis not present

## 2019-07-12 DIAGNOSIS — N3090 Cystitis, unspecified without hematuria: Secondary | ICD-10-CM | POA: Insufficient documentation

## 2019-07-12 DIAGNOSIS — S0990XA Unspecified injury of head, initial encounter: Secondary | ICD-10-CM | POA: Diagnosis present

## 2019-07-12 DIAGNOSIS — I10 Essential (primary) hypertension: Secondary | ICD-10-CM | POA: Insufficient documentation

## 2019-07-12 DIAGNOSIS — Y999 Unspecified external cause status: Secondary | ICD-10-CM | POA: Insufficient documentation

## 2019-07-12 DIAGNOSIS — E860 Dehydration: Secondary | ICD-10-CM | POA: Diagnosis not present

## 2019-07-12 DIAGNOSIS — M25561 Pain in right knee: Secondary | ICD-10-CM | POA: Diagnosis not present

## 2019-07-12 DIAGNOSIS — Y9301 Activity, walking, marching and hiking: Secondary | ICD-10-CM | POA: Diagnosis not present

## 2019-07-12 DIAGNOSIS — G20 Parkinson's disease: Secondary | ICD-10-CM | POA: Diagnosis not present

## 2019-07-12 DIAGNOSIS — E876 Hypokalemia: Secondary | ICD-10-CM | POA: Diagnosis not present

## 2019-07-12 LAB — CBC WITH DIFFERENTIAL/PLATELET
Abs Immature Granulocytes: 0.16 10*3/uL — ABNORMAL HIGH (ref 0.00–0.07)
Basophils Absolute: 0.1 10*3/uL (ref 0.0–0.1)
Basophils Relative: 1 %
Eosinophils Absolute: 0.2 10*3/uL (ref 0.0–0.5)
Eosinophils Relative: 2 %
HCT: 37 % (ref 36.0–46.0)
Hemoglobin: 12.3 g/dL (ref 12.0–15.0)
Immature Granulocytes: 1 %
Lymphocytes Relative: 18 %
Lymphs Abs: 2.2 10*3/uL (ref 0.7–4.0)
MCH: 28.9 pg (ref 26.0–34.0)
MCHC: 33.2 g/dL (ref 30.0–36.0)
MCV: 86.9 fL (ref 80.0–100.0)
Monocytes Absolute: 0.8 10*3/uL (ref 0.1–1.0)
Monocytes Relative: 6 %
Neutro Abs: 8.8 10*3/uL — ABNORMAL HIGH (ref 1.7–7.7)
Neutrophils Relative %: 72 %
Platelets: 337 10*3/uL (ref 150–400)
RBC: 4.26 MIL/uL (ref 3.87–5.11)
RDW: 13.1 % (ref 11.5–15.5)
WBC: 12.1 10*3/uL — ABNORMAL HIGH (ref 4.0–10.5)
nRBC: 0 % (ref 0.0–0.2)

## 2019-07-12 LAB — COMPREHENSIVE METABOLIC PANEL
ALT: <5 U/L (ref 0–44)
AST: 10 U/L — ABNORMAL LOW (ref 15–41)
Albumin: 3.6 g/dL (ref 3.5–5.0)
Alkaline Phosphatase: 99 U/L (ref 38–126)
Anion gap: 12 (ref 5–15)
BUN: 14 mg/dL (ref 8–23)
CO2: 25 mmol/L (ref 22–32)
Calcium: 9 mg/dL (ref 8.9–10.3)
Chloride: 93 mmol/L — ABNORMAL LOW (ref 98–111)
Creatinine, Ser: 0.9 mg/dL (ref 0.44–1.00)
GFR calc Af Amer: >60 mL/min (ref >60)
GFR calc non Af Amer: >60 mL/min (ref >60)
Glucose, Bld: 107 mg/dL — ABNORMAL HIGH (ref 70–99)
Potassium: 3.3 mmol/L — ABNORMAL LOW (ref 3.5–5.1)
Sodium: 130 mmol/L — ABNORMAL LOW (ref 135–145)
Total Bilirubin: 0.5 mg/dL (ref 0.3–1.2)
Total Protein: 6.4 g/dL — ABNORMAL LOW (ref 6.5–8.1)

## 2019-07-12 LAB — URINALYSIS, ROUTINE W REFLEX MICROSCOPIC
Bacteria, UA: NONE SEEN
Glucose, UA: NEGATIVE mg/dL
Hgb urine dipstick: NEGATIVE
Ketones, ur: 20 mg/dL — AB
Nitrite: NEGATIVE
Protein, ur: 30 mg/dL — AB
Specific Gravity, Urine: 1.027 (ref 1.005–1.030)
pH: 5 (ref 5.0–8.0)

## 2019-07-12 LAB — CBG MONITORING, ED: Glucose-Capillary: 101 mg/dL — ABNORMAL HIGH (ref 70–99)

## 2019-07-12 MED ORDER — SODIUM CHLORIDE 0.9 % IV SOLN
1000.0000 mL | INTRAVENOUS | Status: DC
  Administered 2019-07-12: 1000 mL via INTRAVENOUS

## 2019-07-12 MED ORDER — MORPHINE SULFATE (PF) 2 MG/ML IV SOLN
2.0000 mg | Freq: Once | INTRAVENOUS | Status: AC
  Administered 2019-07-12: 2 mg via INTRAVENOUS
  Filled 2019-07-12: qty 1

## 2019-07-12 MED ORDER — CEPHALEXIN 250 MG/5ML PO SUSR: 500.0000 mg | Freq: Four times a day (QID) | ORAL | 0 refills | Status: AC

## 2019-07-12 MED ORDER — CEPHALEXIN 250 MG/5ML PO SUSR
500.0000 mg | Freq: Once | ORAL | Status: AC
  Administered 2019-07-12: 500 mg via ORAL
  Filled 2019-07-12: qty 20

## 2019-07-12 MED ORDER — ONDANSETRON HCL 4 MG/2ML IJ SOLN
4.0000 mg | Freq: Once | INTRAMUSCULAR | Status: AC
  Administered 2019-07-12: 4 mg via INTRAVENOUS
  Filled 2019-07-12: qty 2

## 2019-07-12 MED ORDER — SODIUM CHLORIDE 0.9 % IV BOLUS (SEPSIS)
500.0000 mL | Freq: Once | INTRAVENOUS | Status: AC
  Administered 2019-07-12: 09:00:00 500 mL via INTRAVENOUS

## 2019-07-12 MED ORDER — POTASSIUM CHLORIDE 20 MEQ PO PACK
40.0000 meq | PACK | Freq: Two times a day (BID) | ORAL | Status: DC
  Administered 2019-07-12: 40 meq via ORAL
  Filled 2019-07-12: qty 2

## 2019-07-12 NOTE — ED Triage Notes (Signed)
Per EMS, pt from home tripped and fell in her bedroom. Pt states she hit the back of her head. C/o pain to RT knee. Pt hx of Parkinson's and breast cancer.

## 2019-07-12 NOTE — ED Notes (Signed)
Patient given soft drink with peanut butter crackers.

## 2019-07-12 NOTE — Discharge Instructions (Addendum)
Your CT scans and x-rays are negative for fracture or dislocation.  Your lab work indicates your potassium to be slightly low.  You were given oral potassium.  Please increase foods high in potassium at home.  Your Keflex has been changed to a liquid form.  Please continue to use the Keflex as directed.  Your urinary tract infection has improved significantly.  Please finish the Keflex.  Please increase liquids.  Your lab work indicated some mild dehydration.  Please see Dr. Nevada Crane for additional evaluation and management.  Return to the emergency department if any changes in your condition, problems, or concerns.

## 2019-07-12 NOTE — ED Provider Notes (Signed)
[facility] EMERGENCY DEPARTMENT Provider Note   CSN: [id number] Arrival date & time: 07/12/19  [id number]     History   Chief Complaint Chief Complaint  Patient presents with  . Fall    HPI Deborah Vincent is a 73 y.o. female.     Patient is a 73 year old female who presents to the emergency department after she sustained a fall in her bedroom.  The patient states that she injured her right knee.  She also hit the back of her head.  There was no loss of consciousness.  The patient has no difficulty with her breathing.  She is awake and alert and coherent at this time.   The history is provided by the patient.  Fall This is a new problem. The current episode started 1 to 2 hours ago. The problem has been gradually worsening. Pertinent negatives include no chest pain, no abdominal pain and no shortness of breath. Exacerbated by: palpation of head and movement of knee. Nothing relieves the symptoms. She has tried nothing for the symptoms.    Past Medical History:  Diagnosis Date  . Breast cancer ([place])    Breast Cancer  . Depression   . Hyperlipidemia   . Hypertension   . Neuropathy   . Osteoarthritis   . Parkinson's disease ([place])   . Urinary tract infection     Patient Active Problem List   Diagnosis Date Noted  . Chronic cholecystitis with calculus 06/13/2019  . 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 ABDOMINAL EXPLORATION SURGERY  1999   intestinal adhesions  . ABDOMINAL HYSTERECTOMY  1982  . BREAST LUMPECTOMY Left 04/03/2012  . CHOLECYSTECTOMY N/A 06/13/2019   Procedure: LAPAROSCOPIC CHOLECYSTECTOMY;  Surgeon: Donnie Mesa, MD;  Location: [place];  Service: General;  Laterality: N/A;  . INTRAOPERATIVE CHOLANGIOGRAM N/A 06/13/2019   Procedure: Attempted Intraoperative Cholangiogram;  Surgeon: Donnie Mesa, MD;  Location: [place];  Service: General;  Laterality: N/A;  Attempted  unable to perform  . LAPAROSCOPIC LYSIS OF ADHESIONS N/A 06/13/2019   Procedure: Laparoscopic Lysis Of Adhesions;  Surgeon: Donnie Mesa, MD;  Location: [place];  Service: General;  Laterality: N/A;  . left mastectomy    . MASTECTOMY Left 04/28/2012  . RENAL ANGIOPLASTY Left 1992     OB History   No obstetric history on file.      Home Medications    Prior to Admission medications   Medication Sig Start Date End Date Taking? Authorizing Provider  acetaminophen (TYLENOL) 325 MG tablet Take 650 mg by mouth daily as needed for mild pain or moderate pain.     [provider]  anastrozole (ARIMIDEX) 1 MG tablet Take 1 mg by mouth at bedtime.     [provider]  buPROPion (WELLBUTRIN) 100 MG tablet Take 1 tablet (100 mg total) by mouth 2 (two) times daily. 06/12/19   Norman Clay, MD  carbidopa-levodopa (SINEMET IR) 25-100 MG tablet Take 1 tablet by mouth 5 (five) times daily. 03/13/19   [provider]  cephALEXin (KEFLEX) 500 MG capsule Take 500 mg by mouth 4 (four) times daily.    [provider]  diclofenac sodium (VOLTAREN) 1 % GEL Apply 1 application topically 4 (four) times daily as needed (knee pain).     [provider]  docusate sodium (COLACE) 100 MG capsule Take 200 mg by mouth 2 (two) times daily as needed for mild constipation.  [provider]  entacapone (COMTAN) 200 MG tablet Take one tablet 3 times a day at 6AM/10AM/2PM Patient taking differently: Take 200 mg by mouth 3 (three) times daily.  04/23/19   Tat, Eustace Quail, DO  Lidocaine 4 % PTCH Apply 1 patch topically 2 (two) times daily as needed (pain).    [provider]  lisinopril (PRINIVIL,ZESTRIL) 5 MG tablet Take 5 mg by mouth daily.    [provider]  ondansetron (ZOFRAN) 8 MG tablet Take 8 mg by mouth every 8 (eight) hours as needed for nausea or vomiting.    [provider]  phenylephrine-shark liver oil-mineral oil-petrolatum (PREPARATION  H) 0.25-3-14-71.9 % rectal ointment Place 1 application rectally 2 (two) times daily as needed for hemorrhoids.    [provider]  rasagiline (AZILECT) 1 MG TABS tablet Take 1 mg by mouth daily at 12 noon.     [provider]  TRULANCE 3 MG TABS Take 3 mg by mouth daily. 05/16/19   [provider]    Family History Family History  Problem Relation Age of Onset  . Cancer Mother        GI   . Hypertension Mother   . Depression Mother   . Alcohol abuse Mother   . Lung cancer Father   . Alcohol abuse Father   . Other Sister        Wagner's granuloma    Social History Social History   Tobacco Use  . Smoking status: Never Smoker  . Smokeless tobacco: Never Used  Substance Use Topics  . Alcohol use: No    Frequency: Never    Comment: prior alcohol dependence  . Drug use: Not Currently    Types: Marijuana    Comment: in the 1960's     Allergies   Nubain [nalbuphine hcl], Macrobid [nitrofurantoin macrocrystal], Naproxen, Sulfa antibiotics, and Vicodin [hydrocodone-acetaminophen]   Review of Systems Review of Systems  Constitutional: Positive for appetite change. Negative for activity change.  HENT: Negative for congestion, ear discharge, ear pain, facial swelling, nosebleeds, rhinorrhea, sneezing and tinnitus.   Eyes: Negative for photophobia, pain and discharge.  Respiratory: Negative for cough, choking, shortness of breath and wheezing.   Cardiovascular: Negative for chest pain, palpitations and leg swelling.  Gastrointestinal: Negative for abdominal pain, blood in stool, constipation, diarrhea, nausea and vomiting.  Genitourinary: Positive for dysuria. Negative for difficulty urinating, flank pain, frequency and hematuria.  Musculoskeletal: Positive for arthralgias. Negative for back pain, gait problem, myalgias and neck pain.  Skin: Negative for color change, rash and wound.  Neurological: Negative for dizziness, seizures, syncope, facial  asymmetry, speech difficulty, weakness and numbness.  Hematological: Negative for adenopathy. Does not bruise/bleed easily.  Psychiatric/Behavioral: Negative for agitation, confusion, hallucinations, self-injury and suicidal ideas. The patient is not nervous/anxious.      Physical Exam Updated Vital Signs BP 122/71 (BP Location: Right Arm)   Pulse 88   Temp 98 F (36.7 C) (Oral)   Resp 20   Ht 5\' 2"  (1.575 m)   Wt 70 kg   SpO2 98%   BMI 28.23 kg/m   Physical Exam Vitals signs and nursing note reviewed.  Constitutional:      Appearance: She is well-developed. She is not toxic-appearing.  HENT:     Head: Normocephalic. Contusion present. No raccoon eyes or Battle's sign.      Right Ear: Tympanic membrane and external ear normal.     Left Ear: Tympanic membrane and external ear normal.  Eyes:     General: Lids are normal.     Pupils: Pupils are equal, round, and reactive to light.  Neck:     Musculoskeletal: Normal range of motion and neck supple.     Vascular: No carotid bruit.  Cardiovascular:     Rate and Rhythm: Normal rate and regular rhythm.     Pulses: Normal pulses.     Heart sounds: Normal heart sounds.  Pulmonary:     Effort: No respiratory distress.     Breath sounds: Normal breath sounds.  Chest:     Chest wall: Tenderness present.    Abdominal:     General: Bowel sounds are normal.     Palpations: Abdomen is soft.     Tenderness: There is no abdominal tenderness. There is no guarding.  Musculoskeletal:     Right knee: She exhibits decreased range of motion and swelling. Tenderness found. Lateral joint line tenderness noted.       Legs:  Lymphadenopathy:     Head:     Right side of head: No submandibular adenopathy.     Left side of head: No submandibular adenopathy.     Cervical: No cervical adenopathy.  Skin:    General: Skin is warm and dry.  Neurological:     Mental Status: She is alert and oriented to person, place, and time.     Cranial  Nerves: No cranial nerve deficit.     Sensory: No sensory deficit.  Psychiatric:        Speech: Speech normal.      ED Treatments / Results  Labs (all labs ordered are listed, but only abnormal results are displayed) Labs Reviewed  COMPREHENSIVE METABOLIC PANEL  CBC WITH DIFFERENTIAL/PLATELET  URINALYSIS, ROUTINE W REFLEX MICROSCOPIC  CBG MONITORING, ED    EKG EKG Interpretation  Date/Time:  Thursday July 12 2019 09:07:16 EDT Ventricular Rate:  84 PR Interval:    QRS Duration: 101 QT Interval:  393 QTC Calculation: 465 R Axis:   -54 Text Interpretation:  Sinus rhythm Borderline short PR interval Left anterior fascicular block Abnormal R-wave progression, late transition Abnormal lateral Q waves Baseline wander in lead(s) V3 No STEMI  Confirmed by Nanda Quinton [id number]) on 07/12/2019 9:17:38 AM   Radiology No results found.  Procedures Procedures (including critical care time)  Medications Ordered in ED Medications  sodium chloride 0.9 % bolus 500 mL (has no administration in time range)    Followed by  0.9 %  sodium chloride infusion (has no administration in time range)  morphine 2 MG/ML injection 2 mg (2 mg Intravenous Given 07/12/19 0926)  ondansetron (ZOFRAN) injection 4 mg (4 mg Intravenous Given 07/12/19 0926)     Initial Impression / Assessment and Plan / ED Course  I have reviewed the triage vital signs and the nursing notes.  Pertinent labs & imaging results that were available during my care of the patient were reviewed by me and considered in my medical decision making (see chart for details).          Final Clinical Impressions(s) / ED Diagnoses MDM  Patient tripped and sustained a fall in her bedroom.  She was brought to the emergency department by EMS.  There was no loss of consciousness.  Examination does not reveal any acute neurologic deficits.  CBG shows the glucose to be within normal limits at 101.  Electrocardiogram is negative for  high degree blocks, or life-threatening arrhythmias, or acute STEMI.  Urine analysis shows a  cloudy amber specimen with a specific gravity 1.027.  There is trace of leukocyte esterase, 6-10 red blood cells, and 6-10 white blood cells.  There is a yeast, hyaline casts present, and uric acid crystals present.  Culture has been sent to the lab.  The comprehensive metabolic panel shows the sodium to be 130 which is low, the potassium is low at 3.3, the remainder of the test is within normal limits.  The anion gap is normal at 12.  Oral potassium has been given to the patient.  IV fluids also given to the patient.  The complete blood count shows the white blood cells to be slightly elevated at 12,000, otherwise within normal limits.  The CT scan of the head shows no acute intracranial abnormality.  The CT scan of the cervical spine shows no fracture or subluxation of the cervical spine. X-ray of the pelvis shows osteopenia, but no acute fracture. Patient has pain of the right knee.  Pain is aggravated with even minimal attempt to flex or extend the knee. X-ray of the right knee shows no fracture and no dislocation.  There is noted osteoarthritis with osteophytosis present.  Patient seen with me by Dr. Laverta Baltimore.  Patient will be started on Keflex for the urinary tract infection until the cultures return.  Patient will use Tylenol every 4 hours as needed for pain and soreness.  Patient to follow-up with her primary physician or return to the emergency department if any changes in her condition, problems, or concerns.  Patient is in agreement with this plan.   Final diagnoses:  Fall, initial encounter  Dehydration  Hypokalemia  Urinary tract infection without hematuria, site unspecified    ED Discharge Orders         Ordered    cephALEXin (KEFLEX) 250 MG/5ML suspension  4 times daily     07/12/19 1242           Lily Kocher, PA-C 07/13/19 1310    Long, Wonda Olds, MD 07/13/19 1521

## 2019-07-13 ENCOUNTER — Telehealth: Payer: Self-pay | Admitting: Neurology

## 2019-07-13 LAB — URINE CULTURE

## 2019-07-13 MED ORDER — CARBIDOPA-LEVODOPA 25-100 MG PO TABS: 1.0000 | ORAL_TABLET | Freq: Every day | ORAL | 1 refills | Status: DC

## 2019-07-13 NOTE — Telephone Encounter (Signed)
Assessment and Plan:   1. Parkinson's disease. The patient has tremor, bradykinesia, rigidity and postural instability. -Discussed that I do not think that the levodopa is causing the nausea, since she had it before we even started her on levodopa (well-documented in my 2018 note).  We discussed that there are other forms of levodopa, but I do not think that this would be useful since the nausea preceded the levodopa.  She is off of all other Parkinson's medications.             -Patient does have entacapone at home and was using it "as needed."  I explained to her how entacapone works, and that it just blocks and enzyme that helps to break down levodopa, thereby extending the life of levodopa.  I told her that patients generally do not take it on an as-needed basis.  I really do not have an objection if she stays off of it, but wanted her to understand how it worked for "leg cramps."  Her caregiver is going to try to use that with the first 3 dosages of levodopa.             -She will continue carbidopa/levodopa 25/100 CR, 1 tablet 5 times per day.

## 2019-07-13 NOTE — Telephone Encounter (Signed)
Doren Custard the Pharmacist at [facility] called regarding this patient and her being confused about her Carbidopa Levodopa medication and the dosage? He would like you to please fax him a new Rx with the correct dosage. Fax [phone]. Thanks

## 2019-07-13 NOTE — Telephone Encounter (Signed)
rx sent
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
## 2019-07-23 ENCOUNTER — Telehealth (HOSPITAL_COMMUNITY): Payer: Self-pay | Admitting: Psychiatry

## 2019-07-23 ENCOUNTER — Ambulatory Visit (HOSPITAL_COMMUNITY): Payer: Medicare Other | Admitting: Psychiatry

## 2019-07-23 ENCOUNTER — Other Ambulatory Visit: Payer: Self-pay

## 2019-07-23 NOTE — Telephone Encounter (Signed)
Sent link for video visit through D[facility]. Patient did not sign in. Called the patient  twice for appointment scheduled today. The patient did not answer the phone. Left voice message to contact the office.  

## 2019-07-24 ENCOUNTER — Telehealth: Payer: Self-pay | Admitting: Neurology

## 2019-07-24 ENCOUNTER — Ambulatory Visit: Payer: Medicare Other | Admitting: Neurology

## 2019-07-24 NOTE — Telephone Encounter (Signed)
Patient care taker would like to speak to someone about the patient balance and her falling. She did not get hurt when she fell just scraped her arm up she has a virtual visit on 08-01-19 and is on the wait list

## 2019-07-24 NOTE — Telephone Encounter (Signed)
They told me last visit that she was going into hospice.  Did I misunderstand that?  I suspect falls are from deconditioning and weight loss.

## 2019-07-25 NOTE — Telephone Encounter (Signed)
No answer at 832 07/25/2019

## 2019-07-25 NOTE — Telephone Encounter (Signed)
Pt caretaker advised. Will discuss on follow up.

## 2019-07-30 NOTE — Progress Notes (Signed)
Virtual Visit via Phone (tried via video but it failed after many attempts - same as last time) The purpose of this virtual visit is to provide medical care while limiting exposure to the novel coronavirus.    Consent was obtained for telephone visit:  Yes.   Answered questions that patient had about telephone interaction:  Yes.   I discussed the limitations, risks, security and privacy concerns of performing an evaluation and management service by telephone. I also discussed with the patient that there may be a patient responsible charge related to this service. The patient expressed understanding and agreed to proceed.  Pt location: Home Physician Location: home Name of referring provider:  [name], MD I connected with Jackelyn Poling Dapolito at patients initiation/request on 08/01/2019 at  9:15 AM EDT by telephone and verified that I am speaking with the correct person using two identifiers. Pt MRN:  [id number] Pt DOB:  1945/11/21 Video Participants:  Jackelyn Poling Rallis;  Caregiver Lysbeth Galas  Current Outpatient Medications on File Prior to Visit  Medication Sig Dispense Refill   acetaminophen (TYLENOL) 325 MG tablet Take 650 mg by mouth daily as needed for mild pain or moderate pain.      anastrozole (ARIMIDEX) 1 MG tablet Take 1 mg by mouth at bedtime.      anastrozole (ARIMIDEX) 1 MG tablet Take by mouth.     buPROPion (WELLBUTRIN) 100 MG tablet Take 1 tablet (100 mg total) by mouth 2 (two) times daily. 180 tablet 0   carbidopa-levodopa (SINEMET IR) 25-100 MG tablet Take 1 tablet by mouth 5 (five) times daily. 420 tablet 1   diclofenac sodium (VOLTAREN) 1 % GEL Apply 1 application topically 4 (four) times daily as needed (knee pain).      docusate sodium (COLACE) 100 MG capsule Take 200 mg by mouth 2 (two) times daily as needed for mild constipation.      Lidocaine 4 % PTCH Apply 1 patch topically 2 (two) times daily as needed (pain).     lisinopril (PRINIVIL,ZESTRIL) 5 MG  tablet Take 5 mg by mouth daily.     lisinopril (ZESTRIL) 5 MG tablet Take by mouth.     rasagiline (AZILECT) 1 MG TABS tablet Take 1 mg by mouth daily at 12 noon.      TRULANCE 3 MG TABS Take 3 mg by mouth daily.     entacapone (COMTAN) 200 MG tablet Take one tablet 3 times a day at 6AM/10AM/2PM (Patient not taking: Reported on 08/01/2019) 90 tablet 1   ondansetron (ZOFRAN) 8 MG tablet Take 8 mg by mouth every 8 (eight) hours as needed for nausea or vomiting.     phenylephrine-shark liver oil-mineral oil-petrolatum (PREPARATION H) 0.25-3-14-71.9 % rectal ointment Place 1 application rectally 2 (two) times daily as needed for hemorrhoids.     No current facility-administered medications on file prior to visit.      History of Present Illness:  Patient seen today for follow-up of Parkinson's disease.  She is accompanied by her friend and caregiver who supplements the history.  Medical records have been reviewed since our last visit.  Patient was just seen by me less than a month ago, but the patient requested a work and again today.  She is currently on carbidopa/levodopa 25/100 CR, 1 tablet 5-6 times per day.  Last visit, nausea and weight loss was the big issue, which I thought was unrelated to her Parkinson's medications, as I have changed meds around many times without change  in symptoms. She does state that she didn't think that the cholecystectomy was helpful initially for the nausea but now she thinks it was helpful and is getting better.    She was looking into hospice care and had a meeting with them since our last visit.  She reports that they were seeing hospice as a means of caregiving but they have full time caregiving now.  They do state that she is in hospice now and is eating solid foods and "eating really good now."  Her weight is up to 140 lbs.  Nausea is so much better.  She rarely takes nausea med.  Takes 1/8 of 25 mg phenergan tablet rarely and takes that (1 time every 3  weeks).  Caregiver does not think that she needs hospice any longer, but the patient disagrees.  Medical records have been reviewed since last visit.  On October 8, she was in the emergency room after a fall.  CT of the brain was completed and was nonacute.  She was treated for urinary tract infection, although the culture just showed multiple bacterial morphologies.   Pt states that she has fallen 3 times since our last visit.  She has a walker but the R foot freezes and then she falls.  Happens more often in the evening.  Doesn't seem to be related to wearing off of medication.  She stopped her entacapone as she thought that it caused bladder/urinary retention but admits that she still has that but now wonders if related to her ativan.   Current Outpatient Medications:    acetaminophen (TYLENOL) 325 MG tablet, Take 650 mg by mouth daily as needed for mild pain or moderate pain. , Disp: , Rfl:    anastrozole (ARIMIDEX) 1 MG tablet, Take 1 mg by mouth at bedtime. , Disp: , Rfl:    anastrozole (ARIMIDEX) 1 MG tablet, Take by mouth., Disp: , Rfl:    buPROPion (WELLBUTRIN) 100 MG tablet, Take 1 tablet (100 mg total) by mouth 2 (two) times daily., Disp: 180 tablet, Rfl: 0   carbidopa-levodopa (SINEMET IR) 25-100 MG tablet, Take 1 tablet by mouth 5 (five) times daily., Disp: 420 tablet, Rfl: 1   diclofenac sodium (VOLTAREN) 1 % GEL, Apply 1 application topically 4 (four) times daily as needed (knee pain). , Disp: , Rfl:    docusate sodium (COLACE) 100 MG capsule, Take 200 mg by mouth 2 (two) times daily as needed for mild constipation. , Disp: , Rfl:    Lidocaine 4 % PTCH, Apply 1 patch topically 2 (two) times daily as needed (pain)., Disp: , Rfl:    lisinopril (PRINIVIL,ZESTRIL) 5 MG tablet, Take 5 mg by mouth daily., Disp: , Rfl:    lisinopril (ZESTRIL) 5 MG tablet, Take by mouth., Disp: , Rfl:    rasagiline (AZILECT) 1 MG TABS tablet, Take 1 mg by mouth daily at 12 noon. , Disp: , Rfl:     TRULANCE 3 MG TABS, Take 3 mg by mouth daily., Disp: , Rfl:    entacapone (COMTAN) 200 MG tablet, Take one tablet 3 times a day at 6AM/10AM/2PM (Patient not taking: Reported on 08/01/2019), Disp: 90 tablet, Rfl: 1   ondansetron (ZOFRAN) 8 MG tablet, Take 8 mg by mouth every 8 (eight) hours as needed for nausea or vomiting., Disp: , Rfl:    phenylephrine-shark liver oil-mineral oil-petrolatum (PREPARATION H) 0.25-3-14-71.9 % rectal ointment, Place 1 application rectally 2 (two) times daily as needed for hemorrhoids., Disp: , Rfl:  Observations/Objective:   Vitals:   08/01/19 0905  Weight: 145 lb (65.8 kg)  Height: 5\' 1"  (1.549 m)   Wt Readings from Last 3 Encounters:  08/01/19 145 lb (65.8 kg)  07/12/19 154 lb 5.2 oz (70 kg)  07/04/19 154 lb (69.9 kg)   Unfortunately, I was not able to examine her today.  She was somewhat tearful on the phone.  Speech was fluent and clear.    Chemistry      Component Value Date/Time   NA 130 (L) 07/12/2019 0920   K 3.3 (L) 07/12/2019 0920   CL 93 (L) 07/12/2019 0920   CO2 25 07/12/2019 0920   BUN 14 07/12/2019 0920   CREATININE 0.90 07/12/2019 0920      Component Value Date/Time   CALCIUM 9.0 07/12/2019 0920   ALKPHOS 99 07/12/2019 0920   AST 10 (L) 07/12/2019 0920   ALT <5 07/12/2019 0920   BILITOT 0.5 07/12/2019 0920         Assessment and Plan:   1.  Parkinson's disease.  The patient has tremor, bradykinesia, rigidity and postural instability.            -Patient stopped her entacapone because she thought it caused bladder retention.  I told her I did not think that was the case, but she also was not having any "off" per her caregiver, so they wanted to stay off of that.  -She will continue carbidopa/levodopa 25/100 CR, 1 tablet 5-6 times per day.  -Discussed that freezing is often medication unresponsive.  Discussed that physical therapy is oftentimes the only thing that would help this.  As I spoke with him about this last time,  hospice will not allow physical therapy.  They remember this discussion.  Her caregiver does not think she needs hospice any longer and would like her to be discharged, but the patient disagrees.  They will let me know the final status.  If she gets discharged, we can send a physical therapist to the home.  Otherwise, I talked to the patient and her caregiver about the importance of regular daily schedule.  We talked about online exercise and physical therapy programs that she can do while in the presence of her caregivers.  She should not be walking alone. 2.  Constipation             -I have referred her to GI on 2 different occasions and she has not gone.  Dr. Georgette Dover has now put in a new referral.  That appointment is scheduled for today with a equal GI.  I do think that the constipation is likely a consequence of the Parkinson's disease, as opposed to the medications.   3.  Depression              -still think a contributing issue to sx's.  Has hospice coming in  4.  Nausea             -Virtually resolved.  She is taking rare Phenergan.  I did express to them that Phenergan, on a regular basis, can make Parkinson's disease worse.  They stated that they knew that, but she is not taking it frequently.  5.  Urinary retention  -Discussed with him that this is unrelated to entacapone, and likely unrelated to Parkinson's disease.  She is off of entacapone.  Asked what they could do.  I told them to ask the primary care physician.  She refused referral to urology stating that she has "enough  specialists."     Follow Up Instructions: Last several visits have been done over the telephone.  Expressed to the patient that this really is not a good way to do medicine.  She has been unable to hook up to the video visit.  Stated today that she does not want an in person evaluation in the office, including an in-person follow-up.  She stated that she is only able to do telephone visits.  Depending on her  hospice status, we may need to transition her to hospice physician only.  I will make a telephone visit in the next 6 months, but we may need to reevaluate that.   -I discussed the assessment and treatment plan with the patient. The patient was provided an opportunity to ask questions and all were answered. The patient agreed with the plan and demonstrated an understanding of the instructions.   The patient was advised to call back or seek an in-person evaluation if the symptoms worsen or if the condition fails to improve as anticipated.    Total Time spent in visit with the patient was: 26 minutes, of which more than 50% of the time was spent in counseling and/or coordinating care on home safety.   Pt understands and agrees with the plan of care outlined.     Alonza Bogus, DO

## 2019-08-01 ENCOUNTER — Encounter: Payer: Self-pay | Admitting: Neurology

## 2019-08-01 ENCOUNTER — Telehealth (INDEPENDENT_AMBULATORY_CARE_PROVIDER_SITE_OTHER): Payer: Medicare Other | Admitting: Neurology

## 2019-08-01 ENCOUNTER — Other Ambulatory Visit: Payer: Self-pay

## 2019-08-01 VITALS — Ht 61.0 in | Wt 145.0 lb

## 2019-08-01 DIAGNOSIS — G20 Parkinson's disease: Secondary | ICD-10-CM

## 2019-08-01 DIAGNOSIS — R110 Nausea: Secondary | ICD-10-CM | POA: Diagnosis not present

## 2019-08-07 ENCOUNTER — Telehealth: Payer: Self-pay | Admitting: Neurology

## 2019-08-07 NOTE — Telephone Encounter (Signed)
Unfortunately, I have nothing to offer.  I just saw her less than a week ago.  I worked her in on 10/28 and on 9/30.  Generally, one sees a movement physician q 6 months.  They are calling me for things that are not related to PD (nausea, urinary issues) and I have addressed what I can but there is nothing more that I can do from a parkinsons standpoint

## 2019-08-07 NOTE — Telephone Encounter (Signed)
Embyr gave permission to speak with Lysbeth Galas and I advised him of message below. He still feels they need to discuss this with the provider and I did repeat that she stated to contact hospice or PCP because patient denied urology referral. Lysbeth Galas asked that I request the 12/07/2019 appointment be moved up to a sooner date, please advise

## 2019-08-07 NOTE — Telephone Encounter (Signed)
I stated this to the care giver Lysbeth Galas. He said he understood but did still want me to ask. Advised him of all that you stated.

## 2019-08-07 NOTE — Telephone Encounter (Signed)
Son is not on her DPR but please advise on message below and I will reach out to the patient

## 2019-08-07 NOTE — Telephone Encounter (Signed)
Patient's son called requesting a call back from the nurse. He said the patient cannot urinate in the morning. He said perhaps the carbidopa-levodopa needs to be adjusted.  [facility]

## 2019-08-07 NOTE — Telephone Encounter (Signed)
Are you sure its the son and not caregiver?  In any case, she is on hospice and should talk with hospice director or PCP about this.  Pt refused urology referral.  Not PD related.

## 2019-08-17 ENCOUNTER — Other Ambulatory Visit: Payer: Self-pay

## 2019-08-17 ENCOUNTER — Ambulatory Visit (INDEPENDENT_AMBULATORY_CARE_PROVIDER_SITE_OTHER): Payer: Medicare Other | Admitting: Urology

## 2019-08-17 DIAGNOSIS — R3912 Poor urinary stream: Secondary | ICD-10-CM | POA: Diagnosis not present

## 2019-08-17 DIAGNOSIS — N952 Postmenopausal atrophic vaginitis: Secondary | ICD-10-CM

## 2019-08-19 ENCOUNTER — Other Ambulatory Visit: Payer: Self-pay | Admitting: Adult Health Nurse Practitioner

## 2019-10-08 ENCOUNTER — Other Ambulatory Visit: Payer: Self-pay

## 2019-10-08 ENCOUNTER — Ambulatory Visit: Payer: Medicare Other | Attending: Internal Medicine

## 2019-10-08 DIAGNOSIS — Z20822 Contact with and (suspected) exposure to covid-19: Secondary | ICD-10-CM

## 2019-10-10 ENCOUNTER — Telehealth: Payer: Self-pay | Admitting: *Deleted

## 2019-10-10 LAB — NOVEL CORONAVIRUS, NAA: SARS-CoV-2, NAA: NOT DETECTED

## 2019-10-10 NOTE — Telephone Encounter (Signed)
Patient called,given negative covid results .

## 2019-10-16 ENCOUNTER — Ambulatory Visit: Payer: Self-pay

## 2019-10-16 NOTE — Telephone Encounter (Signed)
  Reason for Disposition . [1] HIGH RISK patient (e.g., age > 73 years, diabetes, heart or lung disease, weak immune system) AND [2] new or worsening symptoms  Answer Assessment - Initial Assessment Questions 1. COVID-19 DIAGNOSIS: "Who made your Coronavirus (COVID-19) diagnosis?" "Was it confirmed by a positive lab test?" If not diagnosed by a HCP, ask "Are there lots of cases (community spread) where you live?" (See public health department website, if unsure)     Symptoms started 3 days ago 2. COVID-19 EXPOSURE: "Was there any known exposure to COVID before the symptoms began?" CDC Definition of close contact: within 6 feet (2 meters) for a total of 15 minutes or more over a 24-hour period.      Caregiver had tested + COVID 3. ONSET: "When did the COVID-19 symptoms start?"      3 days ago 4. WORST SYMPTOM: "What is your worst symptom?" (e.g., cough, fever, shortness of breath, muscle aches)     Fatigue, runny nose, headache 5. COUGH: "Do you have a cough?" If so, ask: "How bad is the cough?"       Had cough- but it seems to have gone away 6. FEVER: "Do you have a fever?" If so, ask: "What is your temperature, how was it measured, and when did it start?"     No fever- some night sweats and chills 7. RESPIRATORY STATUS: "Describe your breathing?" (e.g., shortness of breath, wheezing, unable to speak)      Patient gets a little SOB at times- not uncommon for her with Parkinson 8. BETTER-SAME-WORSE: "Are you getting better, staying the same or getting worse compared to yesterday?"  If getting worse, ask, "In what way?"     Worse today 9. HIGH RISK DISEASE: "Do you have any chronic medical problems?" (e.g., asthma, heart or lung disease, weak immune system, obesity, etc.)     Yes- age, chronic medical condition 10. PREGNANCY: "Is there any chance you are pregnant?" "When was your last menstrual period?"       n/a 11. OTHER SYMPTOMS: "Do you have any other symptoms?"  (e.g., chills, fatigue,  headache, loss of smell or taste, muscle pain, sore throat; new loss of smell or taste especially support the diagnosis of COVID-19)       Chills, fatigue, headache  Protocols used: CORONAVIRUS (COVID-19) DIAGNOSED OR SUSPECTED-A-AH

## 2019-10-16 NOTE — Telephone Encounter (Signed)
Called back pt 3 times and no one answered and unable to leave a message.

## 2019-10-16 NOTE — Telephone Encounter (Signed)
Called daughter Phaedra O'Conner and Lm on [facility] to call back to get appt for her mother who is having covid sx.

## 2019-10-16 NOTE — Telephone Encounter (Signed)
Pt is a hospice pt and was tested for covid 10/08/19. Pt is extremely tired, runny nose, night sweats, feel bad, feels like she is getting the flu.  Pt states the hospice nurse recommend she get retested, but whay she read she should wait 14 days. Would like your opinion.

## 2019-10-17 ENCOUNTER — Ambulatory Visit: Payer: Medicare Other | Attending: Internal Medicine

## 2019-10-17 ENCOUNTER — Other Ambulatory Visit: Payer: Self-pay

## 2019-10-17 DIAGNOSIS — Z20822 Contact with and (suspected) exposure to covid-19: Secondary | ICD-10-CM

## 2019-10-18 LAB — NOVEL CORONAVIRUS, NAA: SARS-CoV-2, NAA: DETECTED — AB

## 2019-11-09 ENCOUNTER — Ambulatory Visit: Payer: Medicare Other | Admitting: Urology

## 2019-11-30 ENCOUNTER — Telehealth: Payer: Self-pay | Admitting: Neurology

## 2019-11-30 NOTE — Telephone Encounter (Signed)
Deborah Vincent patient's caregiver called in to give some information to Dr. Carles Collet prior to [name]'s appointment on 12/07/19. He said that she did have COVID about 2 months ago and that she is doing better and has had her first vaccine. Also, she has been under hospice care after having her gallbladder removed due to not being able to keep her food down. She has gained weight and back up to 167. She should be discharged from hospice soon. She has started going back to the [place] and exercising. He said that her Left leg and Right Arm are more stiff. He said she is having normal bowl movement and regular. He said it takes about 1/2 hour for symptoms to go away. He said she is taking Adavair 2 x a day? Also, he has cut her back to 1/2 dose 2 x a day on Entacapone. He gives her Melatonin 10 mg at night for sleep. Thanks

## 2019-12-06 ENCOUNTER — Encounter: Payer: Self-pay | Admitting: Neurology

## 2019-12-06 NOTE — Progress Notes (Signed)
Phone visit The purpose of this telephone visit is to provide medical care while limiting exposure to the novel coronavirus.    Consent was obtained for telephone visit and initiated by pt/family:  Yes.   Answered questions that patient had about telephone interaction:  Yes.   I discussed the limitations, risks, security and privacy concerns of performing an evaluation and management service by telephone application. I also discussed with the patient that there may be a patient responsible charge related to this service. The patient expressed understanding and agreed to proceed.  Pt location: Home Physician Location: office Name of referring provider:  [name], MD I connected with .Deborah Vincent at patients initiation/request on 12/07/2019 at 10:45 AM EST by telephone and verified that I am speaking with the correct person using two identifiers.  Pt MRN:  [id number] Pt DOB:  02-09-1946  Participants:  [name]; cameron   History of Present Illness:  Patient seen today in follow-up for Parkinson's disease.  Patient is on carbidopa/levodopa 25/100 , 1 tablet 5-6 times per day but this somehow got changed back to the IR along the way and she is tolerating it okay.  She had previously thought nausea was related to Parkinson's disease/medications related to Parkinson's disease and we had reassured her now for years that this has not been the case (have actually completely reworked her Parkinson's medications because of this).  She had her gallbladder taken out because of this.  She then went to hospice because of this, and is now being discharged from hospice because I am happy to report she is doing much better!  She has regained a significant amount of weight (167 per patient).  Last visit, they were complaining about urinary retention.  She had taken herself off of entacapone as she thought that was the cause, but admitted she continued to have urinary retention after that.   She declined a urology referral.  They called me less than a week after our last visit complaining about continued urinary issues.  I explained that this was out of my field of expertise, but would be happy to send her to urology, which she continued to decline.  States that she did ultimately go to see urology and was started on flomax and it made a "huge difference."  She is now on keflex for short term.   Unfortunately, she has only been seen on phone visits for quite some time.  I did get a call a few days ago from her caregiver stating that the patient had Covid a few months ago, but is doing better now.  They apparently started her back on entacapone, but she is taking a half a tablet twice a day (7am/10pm).  C/o wearing off 30 min before levodopa due so she started xanax and "that manages that."  She is taking 0.5 mg xanax bid.  She is doing breathing and meditation exercises.  States that she doesn't walk much b/c of L knee pain and only walks 5-6 steps (enough to transfer).  States that mood has been greatly improved with being able to eat.    Current Movement d/o meds: Carbidopa/levodopa 25/100 IR, 1 tablet 6 times per day - 7am/10am/1pm/4pm/7pm/10pm Entacapone, 200 mg, half tablet twice a day azilect  Prior meds:  Pramipexole; carbidopa/levodopa 25/100 CR (tolerated well but it got changed back accidentally to IR and she tolerated that fine); azilect   Observations/Objective:   There were no vitals filed for this  visit.  Assessment and Plan:   1.  Parkinson's disease  -She will take carbidopa/levodopa 25/100, 1 tablet at 7 AM/10 AM/1 PM/4 PM/7 PM/10 PM  -Discussed that entacapone is not meant to be split in half.  Would like to see her get up to entacapone, 200 mg, 1 tablet with the first 3 dosages of levodopa.  She and I discussed that today.  That would help with the wearing off that she is experiencing.  -She would like to restart her physical therapy once she gets out of hospice care.   I agree with that.  She likely will need primary care to set that up, as often times insurance requires a physical in person visit or video visit, which she has not had with me.  She apparently has had one with primary care as well as with her orthopedic doctor.  -Continue Azilect.  2.  Depression  -Think that this has really been the greatest issue in regards to her care.  She has had very significant depression over the years.  She and I have talked about psychiatry.  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  She does tell me today that when she gets out of hospice care in the middle of the month, she hopes to resume her counseling Myra Gianotti) and I agree with that.  She will let me know if she needs any assistance in setting that up.  3.  Constipation  -Has been on Linzess in the past.  She is currently off of that.  4.  Urinary retention  -Now following with urology.  Doing much better with Flomax.   Need for in person visit now:  No. Follow Up Instructions:  Have discussed the last several visits that I cannot really take care of the patient over the telephone.  The patient is being discharged from hospice care, and she either needs to find care within the home (doctors making house calls) or she needs to come into the office for a visit.  -I discussed the assessment and treatment plan with the patient. The patient was provided an opportunity to ask questions and all were answered. The patient agreed with the plan and demonstrated an understanding of the instructions.   The patient was advised to call back or seek an in-person evaluation if the symptoms worsen or if the condition fails to improve as anticipated.    Total Time spent in visit with the patient was:  15 min, of which 100% of the time was spent in counseling .   Pt understands and agrees with the plan of care outlined.     Alonza Bogus, DO

## 2019-12-07 ENCOUNTER — Ambulatory Visit: Payer: Medicare Other | Admitting: Neurology

## 2019-12-07 ENCOUNTER — Other Ambulatory Visit: Payer: Self-pay

## 2019-12-07 ENCOUNTER — Telehealth (INDEPENDENT_AMBULATORY_CARE_PROVIDER_SITE_OTHER): Payer: Medicare Other | Admitting: Neurology

## 2019-12-07 VITALS — Ht 61.0 in | Wt 167.0 lb

## 2019-12-07 DIAGNOSIS — F331 Major depressive disorder, recurrent, moderate: Secondary | ICD-10-CM | POA: Diagnosis not present

## 2019-12-07 DIAGNOSIS — G20 Parkinson's disease: Secondary | ICD-10-CM | POA: Diagnosis not present

## 2019-12-07 DIAGNOSIS — R339 Retention of urine, unspecified: Secondary | ICD-10-CM | POA: Diagnosis not present

## 2019-12-07 MED ORDER — ENTACAPONE 200 MG PO TABS: 100.0000 mg | ORAL_TABLET | Freq: Three times a day (TID) | ORAL | 1 refills | Status: DC

## 2019-12-07 MED ORDER — CARBIDOPA-LEVODOPA 25-100 MG PO TABS: 1.0000 | ORAL_TABLET | Freq: Every day | ORAL | 1 refills | Status: DC

## 2020-01-07 ENCOUNTER — Other Ambulatory Visit: Payer: Self-pay | Admitting: Adult Health Nurse Practitioner

## 2020-01-14 ENCOUNTER — Telehealth: Payer: Self-pay | Admitting: Neurology

## 2020-01-14 NOTE — Telephone Encounter (Signed)
She was placed on the wait list

## 2020-01-14 NOTE — Telephone Encounter (Signed)
Spoke to pt and pt son who is a Dr and they stated that they didn't want refills on the medication they wanted to know if the medications was a good medication for Deborah Vincent to be taken for dyskinesia? Asking for recommendations? Will do an appointment if needed to talk about medications.

## 2020-01-14 NOTE — Telephone Encounter (Signed)
Son is not on DPR (I have never met a son).  Pt has had multiple work in appts but they had refused to come in for them.  Parkinsons Disease does not cause pain but does cause dyskinesia.  This is not urgent but we can put her on cancellation for the future.  Hinton Dyer, see me about that

## 2020-01-14 NOTE — Telephone Encounter (Signed)
Patient called to report "severe" muscle spasms. She said, "They are really bad." She said when this happened before, she needed some medication changes:  Zanaflex 4 MG, 1 tablet every six hours And/or Xanax 0.5 MG up to 3 times a day currently  [facility] [address] [facility] [place]

## 2020-01-14 NOTE — Telephone Encounter (Signed)
Pt is returning a call she left message on the VM

## 2020-01-14 NOTE — Telephone Encounter (Signed)
I don't prescribe either of these medications to her.  She will need to call prescribing physician.

## 2020-01-14 NOTE — Telephone Encounter (Signed)
Pt called no answer voice mail left for pt to call back 

## 2020-01-28 ENCOUNTER — Telehealth: Payer: Self-pay | Admitting: Neurology

## 2020-01-28 NOTE — Telephone Encounter (Signed)
Error

## 2020-01-30 ENCOUNTER — Encounter: Payer: Self-pay | Admitting: Neurology

## 2020-01-30 NOTE — Progress Notes (Signed)
Virtual Visit Via Video   The purpose of this virtual visit is to provide medical care while limiting exposure to the novel coronavirus.    Consent was obtained for video visit:  Yes.   Answered questions that patient had about telehealth interaction:  Yes.   I discussed the limitations, risks, security and privacy concerns of performing an evaluation and management service by telemedicine. I also discussed with the patient that there may be a patient responsible charge related to this service. The patient expressed understanding and agreed to proceed.  Pt location: Home Physician Location: home Name of referring provider:  [name], MD I connected with Deborah Vincent at patients initiation/request on 01/31/2020 at 11:15 AM EDT by video enabled telemedicine application and verified that I am speaking with the correct person using two identifiers. Pt MRN:  [id number] Pt DOB:  Feb 10, 1946 Video Participants:  Deborah Vincent;  Caregiver Lysbeth Galas and caregiver Katrina supplement the history  Assessment/Plan:   1.  Parkinsons Disease  -Continue continue carbidopa/levodopa 25/100, 1 tablet at 7 AM/10 AM/1 PM/4 PM/7 PM  -d/c the carbidopa/levodopa 25/100 at 10pm  -start carbidopa/levodopa 50/200 q hs (10pm) to help with cramping  -Continue entacapone, 200 mg, 1 tablet the first 3 dosages of levodopa.  -Continue Azilect.  -They asked about Zanaflex.  I did not recommend this.  Do not think it helps with muscle spasm/cramping.  This signifies a need for dopamine.  -He asked me about her Xanax, which she currently takes, on average, 0.5 mg twice a day.  Caregiver states that they looked it up and it said it would help cramping.  Tell them that I do not recommend that for cramping, nor do I recommend Xanax in my Parkinson's population.  It increases risk for falls and cognitive change.  2.  Deg arthritis, L knee  -Regarding upcoming surgery, I recommend:  -take your parkinsons  medication the AM of surgery except azilect  -if medication for nausea is needed after surgery, I recommend zofran instead of phenergan as phenergan can make parkinsons worse  -pt going to have surgery with epidural anesthesia  -hold azilect 2 weeks prior to the sx  3.  Depression  -Has been a great influencer in physical and mental status.    -encouraged her again to get back to counseling with Myra Gianotti.  4.  Chronic nausea and constipation  -The nausea is not from Parkinson's or its medications.  Her medications have been completely worked without success.  -She has been on Linzess in the past for the constipation.  5.  Urinary retention  -On Flomax.  Follows with urology. Subjective:   Deborah Vincent was seen today in follow up for Parkinsons disease.  My previous records were reviewed prior to todays visit as well as outside records available to me. Pt denies falls.  Having trouble mobilizing b/c of L knee pain.  Pt denies lightheadedness, near syncope.  No hallucinations.  Mood has been good. Noting cramping of the feet/legs about 15 min before next dosing and esp at around 3-4 AM.   Plans to have L TKA  Current prescribed movement disorder medications: Carbidopa/levodopa 25/100 IR, 1 tablet 6 times per day - 7am/10am/1pm/4pm/7pm/10pm (is a bit unclear as it it RX this way but caregiver states that they are also giving her a 1am and 4am dose of carbidopa/levodopa but when asking if they are still using 6 per day, he says yes) Entacapone, 200 mg, 1 tablet 3  times per day (increased last visit as she was splitting them in half previously) azilect  Prior meds:  Pramipexole; carbidopa/levodopa 25/100 CR (tolerated well but it got changed back accidentally to IR and she tolerated that fine); azilect   ALLERGIES:   Allergies  Allergen Reactions  . Nubain [Nalbuphine Hcl] Anaphylaxis  . Macrobid [Nitrofurantoin Macrocrystal] Nausea Only  . Naproxen Hives  . Sulfa Antibiotics  Hives and Itching  . Vicodin [Hydrocodone-Acetaminophen] Other (See Comments)    Overuse in past-prefers not to take    CURRENT MEDICATIONS:  Outpatient Encounter Medications as of 01/31/2020  Medication Sig  . acetaminophen (TYLENOL) 325 MG tablet Take 650 mg by mouth daily as needed for mild pain or moderate pain.   Marland Kitchen anastrozole (ARIMIDEX) 1 MG tablet Take 1 mg by mouth at bedtime.   Marland Kitchen buPROPion (WELLBUTRIN) 100 MG tablet Take 100 mg by mouth 2 (two) times daily.  . carbidopa-levodopa (SINEMET IR) 25-100 MG tablet Take 1 tablet by mouth 6 (six) times daily.  . diclofenac sodium (VOLTAREN) 1 % GEL Apply 1 application topically 4 (four) times daily as needed (knee pain).   Mariane Baumgarten Calcium (STOOL SOFTENER PO) Take by mouth in the morning, at noon, in the evening, and at bedtime.  . entacapone (COMTAN) 200 MG tablet Take 0.5 tablets (100 mg total) by mouth 3 (three) times daily. (Patient taking differently: Take 200 mg by mouth 3 (three) times daily. )  . Lidocaine 4 % PTCH Apply 1 patch topically 2 (two) times daily as needed (pain).  Marland Kitchen lisinopril (ZESTRIL) 5 MG tablet Take 2.5 mg by mouth in the morning and at bedtime.   Marland Kitchen LORazepam (ATIVAN) 0.5 MG tablet Take 0.5 mg by mouth as needed for anxiety. No more than three times a day  . phenylephrine-shark liver oil-mineral oil-petrolatum (PREPARATION H) 0.25-3-14-71.9 % rectal ointment Place 1 application rectally 2 (two) times daily as needed for hemorrhoids.  . rasagiline (AZILECT) 1 MG TABS tablet Take 1 mg by mouth daily at 12 noon.   . tamsulosin (FLOMAX) 0.4 MG CAPS capsule Take 1 capsule by mouth daily.   No facility-administered encounter medications on file as of 01/31/2020.    Objective:   PHYSICAL EXAMINATION:    VITALS:   Vitals:   01/30/20 1456  Weight: 160 lb (72.6 kg)  Height: 5\' 2"  (1.575 m)    GEN:  The patient appears stated age and is in NAD. HEENT:  Normocephalic, atraumatic.  The mucous membranes are moist. The  superficial temporal arteries are without ropiness or tenderness.   Neurological examination:  Orientation: The patient is alert and oriented x3. Cranial nerves: There is good facial symmetry with min facial hypomimia. The speech is fluent and clear. Soft palate rises symmetrically and there is no tongue deviation. Hearing is intact to conversational tone. Motor: Strength is at least antigravity x4.  Movement examination: Abnormal movements: none seen Coordination:  There is min decremation with RAM's in hands Gait and Station: picture froze/video froze when attempted and completed last few min on phon  I have reviewed and interpreted the following labs independently    Chemistry      Component Value Date/Time   NA 130 (L) 07/12/2019 0920   K 3.3 (L) 07/12/2019 0920   CL 93 (L) 07/12/2019 0920   CO2 25 07/12/2019 0920   BUN 14 07/12/2019 0920   CREATININE 0.90 07/12/2019 0920      Component Value Date/Time   CALCIUM 9.0 07/12/2019 0920  ALKPHOS 99 07/12/2019 0920   AST 10 (L) 07/12/2019 0920   ALT <5 07/12/2019 0920   BILITOT 0.5 07/12/2019 0920       Lab Results  Component Value Date   WBC 12.1 (H) 07/12/2019   HGB 12.3 07/12/2019   HCT 37.0 07/12/2019   MCV 86.9 07/12/2019   PLT 337 07/12/2019    No results found for: TSH   Total time spent on today's visit was 45 minutes, including both face-to-face time and nonface-to-face time.  Time included that spent on review of records (prior notes available to me/labs/imaging if pertinent), discussing treatment and goals, answering patient's questions and coordinating care.  Cc:  [name], MD

## 2020-01-31 ENCOUNTER — Telehealth (INDEPENDENT_AMBULATORY_CARE_PROVIDER_SITE_OTHER): Payer: Medicare Other | Admitting: Neurology

## 2020-01-31 ENCOUNTER — Other Ambulatory Visit: Payer: Self-pay

## 2020-01-31 VITALS — Ht 62.0 in | Wt 160.0 lb

## 2020-01-31 DIAGNOSIS — F331 Major depressive disorder, recurrent, moderate: Secondary | ICD-10-CM

## 2020-01-31 DIAGNOSIS — G20 Parkinson's disease: Secondary | ICD-10-CM

## 2020-01-31 DIAGNOSIS — G20A1 Parkinson's disease without dyskinesia, without mention of fluctuations: Secondary | ICD-10-CM

## 2020-01-31 MED ORDER — CARBIDOPA-LEVODOPA 25-100 MG PO TABS: 1.0000 | ORAL_TABLET | Freq: Every day | ORAL | 1 refills | Status: DC

## 2020-01-31 MED ORDER — ENTACAPONE 200 MG PO TABS: 200.0000 mg | ORAL_TABLET | Freq: Three times a day (TID) | ORAL | 1 refills | Status: DC

## 2020-01-31 MED ORDER — CARBIDOPA-LEVODOPA ER 50-200 MG PO TBCR: 1.0000 | EXTENDED_RELEASE_TABLET | Freq: Every day | ORAL | 1 refills | Status: DC

## 2020-02-27 ENCOUNTER — Telehealth: Payer: Self-pay | Admitting: Neurology

## 2020-02-27 NOTE — Telephone Encounter (Signed)
Left detailed message with Dr Doristine Devoid recommendations. Reiterated that patient is only supposed to take carbidopa-levodopa 5 times a day.  Advised patient to contact the office with any questions or concerns.  Contacted pharmacy and advised them that patient is supposed to take medication 5 times a day. Pharmacist voiced understanding.

## 2020-02-27 NOTE — Telephone Encounter (Signed)
No.  Its 5 times a day.  She was supposed to d/c the 10 pm.  See note for details.  Continue continue carbidopa/levodopa 25/100, 1 tablet at 7 AM/10 AM/1 PM/4 PM/7 PM             -d/c the carbidopa/levodopa 25/100 at 10pm

## 2020-02-27 NOTE — Telephone Encounter (Signed)
Patient called in stating she is having a hard time getting her prescription for the carbidopa-levodopa IR refilled. She thinks its due to the order stating 5 times a day. She remembered discussing 6 times a day. [facility] [address] in [place]

## 2020-03-25 ENCOUNTER — Telehealth: Payer: Self-pay | Admitting: Neurology

## 2020-03-25 NOTE — Telephone Encounter (Signed)
Patient's caregiver called in stating we have the patient on carbidopa-levodopa 5 times a day, but she is taking it 6 times a day and [facility] [address]

## 2020-03-25 NOTE — Telephone Encounter (Signed)
See 02/27/20 phone call.  This has been addressed previously

## 2020-03-26 NOTE — Telephone Encounter (Signed)
Left message of the above see previous note from Dr.Tat

## 2020-04-01 ENCOUNTER — Emergency Department (HOSPITAL_COMMUNITY): Payer: Medicare Other

## 2020-04-01 ENCOUNTER — Other Ambulatory Visit: Payer: Self-pay

## 2020-04-01 ENCOUNTER — Emergency Department (HOSPITAL_COMMUNITY)
Admission: EM | Admit: 2020-04-01 | Discharge: 2020-04-03 | Disposition: A | Discharge status: Home / Self Care | Payer: Medicare Other | Attending: Emergency Medicine | Admitting: Emergency Medicine

## 2020-04-01 ENCOUNTER — Encounter (HOSPITAL_COMMUNITY): Payer: Self-pay | Admitting: Emergency Medicine

## 2020-04-01 DIAGNOSIS — Y9389 Activity, other specified: Secondary | ICD-10-CM | POA: Insufficient documentation

## 2020-04-01 DIAGNOSIS — W1830XA Fall on same level, unspecified, initial encounter: Secondary | ICD-10-CM | POA: Insufficient documentation

## 2020-04-01 DIAGNOSIS — M25562 Pain in left knee: Secondary | ICD-10-CM | POA: Insufficient documentation

## 2020-04-01 DIAGNOSIS — Z20822 Contact with and (suspected) exposure to covid-19: Secondary | ICD-10-CM | POA: Insufficient documentation

## 2020-04-01 DIAGNOSIS — G8918 Other acute postprocedural pain: Secondary | ICD-10-CM | POA: Diagnosis not present

## 2020-04-01 DIAGNOSIS — I10 Essential (primary) hypertension: Secondary | ICD-10-CM | POA: Diagnosis not present

## 2020-04-01 DIAGNOSIS — Y9289 Other specified places as the place of occurrence of the external cause: Secondary | ICD-10-CM | POA: Diagnosis not present

## 2020-04-01 DIAGNOSIS — Y998 Other external cause status: Secondary | ICD-10-CM | POA: Insufficient documentation

## 2020-04-01 DIAGNOSIS — W19XXXA Unspecified fall, initial encounter: Secondary | ICD-10-CM

## 2020-04-01 LAB — COMPREHENSIVE METABOLIC PANEL
ALT: 6 U/L (ref 0–44)
AST: 11 U/L — ABNORMAL LOW (ref 15–41)
Albumin: 3 g/dL — ABNORMAL LOW (ref 3.5–5.0)
Alkaline Phosphatase: 78 U/L (ref 38–126)
Anion gap: 11 (ref 5–15)
BUN: 15 mg/dL (ref 8–23)
CO2: 27 mmol/L (ref 22–32)
Calcium: 8.6 mg/dL — ABNORMAL LOW (ref 8.9–10.3)
Chloride: 97 mmol/L — ABNORMAL LOW (ref 98–111)
Creatinine, Ser: 0.75 mg/dL (ref 0.44–1.00)
GFR calc Af Amer: >60 mL/min (ref >60)
GFR calc non Af Amer: >60 mL/min (ref >60)
Glucose, Bld: 96 mg/dL (ref 70–99)
Potassium: 4 mmol/L (ref 3.5–5.1)
Sodium: 135 mmol/L (ref 135–145)
Total Bilirubin: 0.7 mg/dL (ref 0.3–1.2)
Total Protein: 6.3 g/dL — ABNORMAL LOW (ref 6.5–8.1)

## 2020-04-01 LAB — SARS CORONAVIRUS 2 BY RT PCR (HOSPITAL ORDER, PERFORMED IN ~~LOC~~ HOSPITAL LAB): SARS Coronavirus 2: NEGATIVE

## 2020-04-01 LAB — CBC WITH DIFFERENTIAL/PLATELET
Abs Immature Granulocytes: 0.1 10*3/uL — ABNORMAL HIGH (ref 0.00–0.07)
Basophils Absolute: 0 10*3/uL (ref 0.0–0.1)
Basophils Relative: 0 %
Eosinophils Absolute: 0.5 10*3/uL (ref 0.0–0.5)
Eosinophils Relative: 5 %
HCT: 34.6 % — ABNORMAL LOW (ref 36.0–46.0)
Hemoglobin: 11.6 g/dL — ABNORMAL LOW (ref 12.0–15.0)
Immature Granulocytes: 1 %
Lymphocytes Relative: 19 %
Lymphs Abs: 1.7 10*3/uL (ref 0.7–4.0)
MCH: 31.5 pg (ref 26.0–34.0)
MCHC: 33.5 g/dL (ref 30.0–36.0)
MCV: 94 fL (ref 80.0–100.0)
Monocytes Absolute: 0.7 10*3/uL (ref 0.1–1.0)
Monocytes Relative: 8 %
Neutro Abs: 5.9 10*3/uL (ref 1.7–7.7)
Neutrophils Relative %: 67 %
Platelets: 274 10*3/uL (ref 150–400)
RBC: 3.68 MIL/uL — ABNORMAL LOW (ref 3.87–5.11)
RDW: 12.9 % (ref 11.5–15.5)
WBC: 9 10*3/uL (ref 4.0–10.5)
nRBC: 0 % (ref 0.0–0.2)

## 2020-04-01 MED ORDER — OXYCODONE-ACETAMINOPHEN 5-325 MG PO TABS
1.0000 | ORAL_TABLET | ORAL | Status: DC | PRN
  Administered 2020-04-02 – 2020-04-03 (×5): 1 via ORAL
  Filled 2020-04-01 (×5): qty 1

## 2020-04-01 MED ORDER — AMOXICILLIN-POT CLAVULANATE 500-125 MG PO TABS
1.0000 | ORAL_TABLET | Freq: Three times a day (TID) | ORAL | Status: DC
  Administered 2020-04-02 – 2020-04-03 (×4): 500 mg via ORAL
  Filled 2020-04-01 (×5): qty 1

## 2020-04-01 MED ORDER — BUPROPION HCL 100 MG PO TABS
100.0000 mg | ORAL_TABLET | Freq: Two times a day (BID) | ORAL | Status: DC
  Administered 2020-04-02 – 2020-04-03 (×4): 100 mg via ORAL
  Filled 2020-04-01 (×7): qty 1

## 2020-04-01 MED ORDER — LISINOPRIL 5 MG PO TABS
2.5000 mg | ORAL_TABLET | Freq: Every day | ORAL | Status: DC
  Administered 2020-04-02 – 2020-04-03 (×2): 2.5 mg via ORAL
  Filled 2020-04-01 (×2): qty 1

## 2020-04-01 MED ORDER — ALPRAZOLAM 0.5 MG PO TABS
0.5000 mg | ORAL_TABLET | Freq: Three times a day (TID) | ORAL | Status: DC | PRN
  Administered 2020-04-02 – 2020-04-03 (×3): 0.5 mg via ORAL
  Filled 2020-04-01 (×3): qty 1

## 2020-04-01 MED ORDER — ACETAMINOPHEN 325 MG PO TABS
650.0000 mg | ORAL_TABLET | Freq: Every day | ORAL | Status: DC | PRN
  Administered 2020-04-02 – 2020-04-03 (×2): 650 mg via ORAL
  Filled 2020-04-01 (×2): qty 2

## 2020-04-01 MED ORDER — TAMSULOSIN HCL 0.4 MG PO CAPS
0.4000 mg | ORAL_CAPSULE | Freq: Every day | ORAL | Status: DC
  Administered 2020-04-02: 0.4 mg via ORAL
  Filled 2020-04-01: qty 1

## 2020-04-01 MED ORDER — CARBIDOPA-LEVODOPA ER 50-200 MG PO TBCR
1.0000 | EXTENDED_RELEASE_TABLET | Freq: Every day | ORAL | Status: DC
  Administered 2020-04-01 – 2020-04-03 (×3): 1 via ORAL
  Filled 2020-04-01 (×4): qty 1

## 2020-04-01 MED ORDER — ENTACAPONE 200 MG PO TABS
200.0000 mg | ORAL_TABLET | Freq: Three times a day (TID) | ORAL | Status: DC
  Administered 2020-04-01 – 2020-04-03 (×6): 200 mg via ORAL
  Filled 2020-04-01 (×9): qty 1

## 2020-04-01 MED ORDER — ANASTROZOLE 1 MG PO TABS
1.0000 mg | ORAL_TABLET | Freq: Every day | ORAL | Status: DC
  Administered 2020-04-02 (×2): 1 mg via ORAL
  Filled 2020-04-01 (×3): qty 1

## 2020-04-01 MED ORDER — DOCUSATE SODIUM 100 MG PO CAPS
100.0000 mg | ORAL_CAPSULE | Freq: Every day | ORAL | Status: DC
  Administered 2020-04-02 – 2020-04-03 (×2): 100 mg via ORAL
  Filled 2020-04-01 (×2): qty 1

## 2020-04-01 MED ORDER — ASPIRIN EC 325 MG PO TBEC
325.0000 mg | DELAYED_RELEASE_TABLET | Freq: Every day | ORAL | Status: DC
  Administered 2020-04-02 – 2020-04-03 (×2): 325 mg via ORAL
  Filled 2020-04-01 (×2): qty 1

## 2020-04-01 NOTE — ED Notes (Signed)
Primary Caregiver at bedside. Celesta Aver [phone]

## 2020-04-01 NOTE — ED Triage Notes (Signed)
Pt fell trying to get to bedside commode. Pt had recent left knee replacement. Pt c/o right hip pain and left knee pain.

## 2020-04-01 NOTE — ED Notes (Signed)
Pt placed on purewick 

## 2020-04-01 NOTE — ED Provider Notes (Addendum)
[facility] EMERGENCY DEPARTMENT Provider Note   CSN: [id number] Arrival date & time: 04/01/20  1913     History Chief Complaint  Patient presents with   Deborah Vincent is a 74 y.o. female.  The history is provided by the patient. No language interpreter was used.  Fall This is a new problem. The current episode started 1 to 2 hours ago. The problem has not changed since onset.Nothing aggravates the symptoms. Nothing relieves the symptoms. She has tried nothing for the symptoms.   Pt fell at home today transferring to a bedside commode.  Pt had knee surgery on 6/23.    Pt's daughter reports pt chose to go home after surgery but she and pt feel she needs to go to a rehab facility     Past Medical History:  Diagnosis Date   Breast cancer ([place])    Breast Cancer   Depression    Hyperlipidemia    Hypertension    Neuropathy    Osteoarthritis    Parkinson's disease ([place])    Urinary tract infection     Patient Active Problem List   Diagnosis Date Noted   Chronic cholecystitis with calculus 06/13/2019   MDD (major depressive disorder), recurrent episode, mild ([place]) 02/05/2019   Parkinson's disease ([place]) 04/18/2018   Low back pain 04/18/2018   Hypertensive disorder 04/18/2018   Depressive disorder 04/18/2018   Osteoarthritis of left knee 04/18/2018    Past Surgical History:  Procedure Laterality Date   ABDOMINAL EXPLORATION SURGERY  1999   intestinal adhesions   ABDOMINAL HYSTERECTOMY  1982   BREAST LUMPECTOMY Left 04/03/2012   CHOLECYSTECTOMY N/A 06/13/2019   Procedure: LAPAROSCOPIC CHOLECYSTECTOMY;  Surgeon: Donnie Mesa, MD;  Location: [place];  Service: General;  Laterality: N/A;   INTRAOPERATIVE CHOLANGIOGRAM N/A 06/13/2019   Procedure: Attempted Intraoperative Cholangiogram;  Surgeon: Donnie Mesa, MD;  Location: [place];  Service: General;  Laterality: N/A;  Attempted unable to perform   LAPAROSCOPIC LYSIS OF ADHESIONS N/A  06/13/2019   Procedure: Laparoscopic Lysis Of Adhesions;  Surgeon: Donnie Mesa, MD;  Location: [place];  Service: General;  Laterality: N/A;   left mastectomy     MASTECTOMY Left 04/28/2012   RENAL ANGIOPLASTY Left 1992   REVISION TOTAL KNEE ARTHROPLASTY       OB History   No obstetric history on file.     Family History  Problem Relation Age of Onset   Cancer Mother        GI    Hypertension Mother    Depression Mother    Alcohol abuse Mother    Lung cancer Father    Alcohol abuse Father    Other Sister        Wagner's granuloma    Social History   Tobacco Use   Smoking status: Never Smoker   Smokeless tobacco: Never Used  Scientific laboratory technician Use: Never used  Substance Use Topics   Alcohol use: No    Comment: prior alcohol dependence   Drug use: Not Currently    Types: Marijuana    Comment: in the 1960's    Home Medications Prior to Admission medications   Medication Sig Start Date End Date Taking? Authorizing Provider  acetaminophen (TYLENOL) 325 MG tablet Take 650 mg by mouth daily as needed for mild pain or moderate pain.    Yes [provider]  ALPRAZolam Duanne Moron) 0.5 MG tablet Take 0.5 mg by mouth 3 (three) times daily as  needed for anxiety.   Yes [provider]  amoxicillin-clavulanate (AUGMENTIN) 500-125 MG tablet Take 1 tablet by mouth in the morning and at bedtime. 7 day course starting on 03/27/2020   Yes [provider]  anastrozole (ARIMIDEX) 1 MG tablet Take 1 mg by mouth at bedtime.    Yes [provider]  aspirin EC 325 MG tablet Take 325 mg by mouth in the morning and at bedtime.   Yes [provider]  buPROPion (WELLBUTRIN) 100 MG tablet Take 100 mg by mouth 2 (two) times daily.   Yes [provider]  carbidopa-levodopa (SINEMET CR) 50-200 MG tablet Take 1 tablet by mouth at bedtime. 01/31/20  Yes Tat, Eustace Quail, DO  carbidopa-levodopa (SINEMET IR) 25-100 MG tablet Take 1 tablet by  mouth 5 (five) times daily. 7 AM/10 AM/1 PM/4 PM/7 PM 01/31/20  Yes Tat, Eustace Quail, DO  Cholecalciferol (VITAMIN D3) 125 MCG (5000 UT) TBDP Take 5,000 Units by mouth daily.   Yes [provider]  diclofenac sodium (VOLTAREN) 1 % GEL Apply 1 application topically 4 (four) times daily as needed (knee pain).    Yes [provider]  Docusate Calcium (STOOL SOFTENER PO) Take by mouth in the morning, at noon, in the evening, and at bedtime.   Yes [provider]  entacapone (COMTAN) 200 MG tablet Take 1 tablet (200 mg total) by mouth 3 (three) times daily. Patient taking differently: Take 200 mg by mouth 3 (three) times daily. Takes with 1st 3 doses of Sinemet (7q, 10a, and 1pm) 01/31/20  Yes Tat, Wells Guiles S, DO  Lidocaine 4 % PTCH Apply 1 patch topically 2 (two) times daily as needed (pain).   Yes [provider]  lisinopril (ZESTRIL) 5 MG tablet Take 2.5 mg by mouth in the morning and at bedtime.    Yes [provider]  nystatin cream (MYCOSTATIN) Apply 1 application topically 2 (two) times daily as needed for dry skin.   Yes [provider]  oxyCODONE-acetaminophen (PERCOCET/ROXICET) 5-325 MG tablet Take 1 tablet by mouth every 4 (four) hours as needed for moderate pain or severe pain.  03/27/20  Yes [provider]  tamsulosin (FLOMAX) 0.4 MG CAPS capsule Take 0.4 mg by mouth daily after supper.  01/10/20  Yes [provider]  phenylephrine-shark liver oil-mineral oil-petrolatum (PREPARATION H) 0.25-3-14-71.9 % rectal ointment Place 1 application rectally 2 (two) times daily as needed for hemorrhoids.    [provider]    Allergies    Nubain [nalbuphine hcl], Ciprofloxacin, Macrobid [nitrofurantoin macrocrystal], Naproxen, Sulfa antibiotics, Vicodin [hydrocodone-acetaminophen], and Other  Review of Systems   Review of Systems  All other systems reviewed and are negative.   Physical Exam Updated Vital Signs BP 139/70     Pulse 89    Temp 98.1 F (36.7 C)    Resp 16    Ht 5\' 2"  (1.575 m)    Wt 77.1 kg    SpO2 95%    BMI 31.09 kg/m   Physical Exam Vitals and nursing note reviewed.  Constitutional:      Appearance: She is well-developed.  HENT:     Head: Normocephalic.     Mouth/Throat:     Mouth: Mucous membranes are moist.  Cardiovascular:     Rate and Rhythm: Normal rate.  Pulmonary:     Effort: Pulmonary effort is normal.  Abdominal:     General: There is no distension.     Palpations: Abdomen is soft.  Musculoskeletal:  General: Swelling and tenderness present.     Cervical back: Normal range of motion.     Comments: Swelling left knee, bruising.    Skin:    General: Skin is warm.  Neurological:     General: No focal deficit present.     Mental Status: She is alert and oriented to person, place, and time.  Psychiatric:        Mood and Affect: Mood normal.     ED Results / Procedures / Treatments   Labs (all labs ordered are listed, but only abnormal results are displayed) Labs Reviewed - No data to display  EKG None  Radiology No results found.  Procedures Procedures (including critical care time)  Medications Ordered in ED Medications - No data to display  ED Course  I have reviewed the triage vital signs and the nursing notes.  Pertinent labs & imaging results that were available during my care of the patient were reviewed by me and considered in my medical decision making (see chart for details).    MDM Rules/Calculators/A&P                          MDM:  Xray of knee shows no evidence of fracture.   Family and pt request rehab facility placement.   Consult transition of care Final Clinical Impression(s) / ED Diagnoses Final diagnoses:  Fall, initial encounter  Postoperative pain of left knee    Rx / DC Orders ED Discharge Orders    None       Sidney Ace 04/01/20 2323    Fransico Meadow, PA-C 04/01/20 2333    Milton Ferguson,  MD 04/02/20 1504

## 2020-04-02 MED ORDER — CARBIDOPA-LEVODOPA 25-100 MG PO TABS
1.0000 | ORAL_TABLET | ORAL | Status: DC
  Administered 2020-04-02 – 2020-04-03 (×8): 1 via ORAL
  Filled 2020-04-02 (×17): qty 1

## 2020-04-02 NOTE — TOC Progression Note (Signed)
Transition of Care [facility]) - Progression Note    Patient Details  Name: Deborah Vincent MRN: [id number] Date of Birth: April 28, 1946  Transition of Care [facility]) CM/SW [place], RN Phone Number: 04/02/2020, 3:49 PM  Clinical Narrative:    [name] and PASRR completed. Bed request sent out and awaiting bed offer.PASRR [id number].    Expected Discharge Plan: Skilled Nursing Facility Barriers to Discharge: SNF Pending bed offer  Expected Discharge Plan and Services Expected Discharge Plan: [place] arrangements for the past 2 months: Single Family Home                                       Social Determinants of Health (SDOH) Interventions    Readmission Risk Interventions No flowsheet data found.

## 2020-04-02 NOTE — ED Notes (Signed)
Called AC for Sinemet IR.

## 2020-04-02 NOTE — NC FL2 (Signed)
[place] LEVEL OF CARE SCREENING TOOL     IDENTIFICATION  Patient Name: Deborah Vincent Birthdate: 1946-09-11 Sex: female Admission Date (Current Location): 04/01/2020  [facility] and [place] Number:  [employer] and Address:  [place] [address], [place]      Provider Number: [id number]  Attending Physician Name and Address:  Default, Provider, MD  Relative Name and Phone Number:       Current Level of Care: Hospital Recommended Level of Care: [place] Prior Approval Number:    Date Approved/Denied:   PASRR Number:    Discharge Plan: SNF    Current Diagnoses: Patient Active Problem List   Diagnosis Date Noted  . Chronic cholecystitis with calculus 06/13/2019  . MDD (major depressive disorder), recurrent episode, mild ([place]) 02/05/2019  . Parkinson's disease ([place]) 04/18/2018  . Low back pain 04/18/2018  . Hypertensive disorder 04/18/2018  . Depressive disorder 04/18/2018  . Osteoarthritis of left knee 04/18/2018    Orientation RESPIRATION BLADDER Height & Weight     Self, Time, Situation, Place  Normal Continent Weight: 77.1 kg Height:  5\' 2"  (157.5 cm)  BEHAVIORAL SYMPTOMS/MOOD NEUROLOGICAL BOWEL NUTRITION STATUS      Continent Diet  AMBULATORY STATUS COMMUNICATION OF NEEDS Skin   Extensive Assist Verbally Normal                       Personal Care Assistance Level of Assistance  Bathing, Dressing Bathing Assistance: Limited assistance   Dressing Assistance: Limited assistance     Functional Limitations Info             SPECIAL CARE FACTORS FREQUENCY  PT (By licensed PT), OT (By licensed OT)     PT Frequency: min 5xweek OT Frequency: min5xweek            Contractures      Additional Factors Info                  Current Medications (04/02/2020):  This is the current hospital active medication list Current Facility-Administered Medications  Medication  Dose Route Frequency Provider Last Rate Last Admin  . acetaminophen (TYLENOL) tablet 650 mg  650 mg Oral Daily PRN Fransico Meadow, PA-C   650 mg at 04/02/20 1405  . ALPRAZolam Duanne Moron) tablet 0.5 mg  0.5 mg Oral TID PRN Caryl Ada K, PA-C   0.5 mg at 04/02/20 9381  . amoxicillin-clavulanate (AUGMENTIN) 500-125 MG per tablet 500 mg  1 tablet Oral Q8H Fransico Meadow, PA-C   500 mg at 04/02/20 1309  . anastrozole (ARIMIDEX) tablet 1 mg  1 mg Oral QHS Sofia, Leslie K, [place]   1 mg at 04/02/20 0015  . aspirin EC tablet 325 mg  325 mg Oral Daily Fransico Meadow, [place]   325 mg at 04/02/20 8299  . buPROPion [facility]) tablet 100 mg  100 mg Oral BID Fransico Meadow, PA-C   100 mg at 04/02/20 3716  . carbidopa-levodopa (SINEMET CR) 50-200 MG per tablet controlled release 1 tablet  1 tablet Oral QHS Fransico Meadow, [place]   1 tablet at 04/01/20 2335  . carbidopa-levodopa (SINEMET IR) 25-100 MG per tablet immediate release 1 tablet  1 tablet Oral 5 times per day Ezequiel Essex, MD   1 tablet at 04/02/20 1310  . docusate sodium (COLACE) capsule 100 mg  100 mg Oral Daily Sofia, Leslie K, PA-C   100 mg  at 04/02/20 0937  . entacapone (COMTAN) tablet 200 mg  200 mg Oral TID Sofia, Leslie K, PA-C   200 mg at 04/02/20 1100  . lisinopril (ZESTRIL) tablet 2.5 mg  2.5 mg Oral Daily Fransico Meadow, PA-C   2.5 mg at 04/02/20 1610  . oxyCODONE-acetaminophen (PERCOCET/ROXICET) 5-325 MG per tablet 1 tablet  1 tablet Oral Q4H PRN Fransico Meadow, PA-C   1 tablet at 04/02/20 1100  . tamsulosin (FLOMAX) capsule 0.4 mg  0.4 mg Oral QPC supper Fransico Meadow, [place]       Current Outpatient Medications  Medication Sig Dispense Refill  . acetaminophen (TYLENOL) 325 MG tablet Take 650 mg by mouth daily as needed for mild pain or moderate pain.     Marland Kitchen ALPRAZolam (XANAX) 0.5 MG tablet Take 0.5 mg by mouth 3 (three) times daily as needed for anxiety.    Marland Kitchen amoxicillin-clavulanate (AUGMENTIN) 500-125 MG tablet Take 1 tablet by  mouth in the morning and at bedtime. 7 day course starting on 03/27/2020    . anastrozole (ARIMIDEX) 1 MG tablet Take 1 mg by mouth at bedtime.     Marland Kitchen aspirin EC 325 MG tablet Take 325 mg by mouth in the morning and at bedtime.    Marland Kitchen buPROPion (WELLBUTRIN) 100 MG tablet Take 100 mg by mouth 2 (two) times daily.    . carbidopa-levodopa (SINEMET CR) 50-200 MG tablet Take 1 tablet by mouth at bedtime. 90 tablet 1  . carbidopa-levodopa (SINEMET IR) 25-100 MG tablet Take 1 tablet by mouth 5 (five) times daily. 7 AM/10 AM/1 PM/4 PM/7 PM 540 tablet 1  . Cholecalciferol (VITAMIN D3) 125 MCG (5000 UT) TBDP Take 5,000 Units by mouth daily.    . diclofenac sodium (VOLTAREN) 1 % GEL Apply 1 application topically 4 (four) times daily as needed (knee pain).     Mariane Baumgarten Calcium (STOOL SOFTENER PO) Take by mouth in the morning, at noon, in the evening, and at bedtime.    . entacapone (COMTAN) 200 MG tablet Take 1 tablet (200 mg total) by mouth 3 (three) times daily. (Patient taking differently: Take 200 mg by mouth 3 (three) times daily. Takes with 1st 3 doses of Sinemet (7q, 10a, and 1pm)) 270 tablet 1  . Lidocaine 4 % PTCH Apply 1 patch topically 2 (two) times daily as needed (pain).    Marland Kitchen lisinopril (ZESTRIL) 5 MG tablet Take 2.5 mg by mouth in the morning and at bedtime.     Marland Kitchen nystatin cream (MYCOSTATIN) Apply 1 application topically 2 (two) times daily as needed for dry skin.    Marland Kitchen oxyCODONE-acetaminophen (PERCOCET/ROXICET) 5-325 MG tablet Take 1 tablet by mouth every 4 (four) hours as needed for moderate pain or severe pain.     . tamsulosin (FLOMAX) 0.4 MG CAPS capsule Take 0.4 mg by mouth daily after supper.     . phenylephrine-shark liver oil-mineral oil-petrolatum (PREPARATION H) 0.25-3-14-71.9 % rectal ointment Place 1 application rectally 2 (two) times daily as needed for hemorrhoids.       Discharge Medications: Please see discharge summary for a list of discharge medications.  Relevant Imaging  Results:  Relevant Lab Results:   Additional Information SS#[id number]  Anselm Pancoast, RN

## 2020-04-02 NOTE — Evaluation (Signed)
Physical Therapy Evaluation Patient Details Name: Deborah Vincent MRN: [id number] DOB: 01-12-1946 Today's Date: 04/02/2020   History of Present Illness  JEANNELLE WIENS is a 74 y.o. female, Pt fell at home today transferring to a bedside commode.  Pt had knee surgery on 6/23.     Clinical Impression  Patient demonstrates slow labored movement for sitting up at bedside, very unsteady on feet and at high risk for falls, limited to a few side steps at bedside requiring tactile assistance to move LLE due to increased pain when weightbearing and required Mod assist to reposition when put back to bed.  Left knee ROM at 0-95 degrees, limited most due to pain at end range flexion.  Patient will benefit from continued physical therapy in hospital and recommended venue below to increase strength, balance, endurance for safe ADLs and gait.     Follow Up Recommendations SNF    Equipment Recommendations  None recommended by PT    Recommendations for Other Services       Precautions / Restrictions Precautions Precautions: Fall Restrictions Weight Bearing Restrictions: No      Mobility  Bed Mobility Overal bed mobility: Needs Assistance Bed Mobility: Supine to Sit;Sit to Supine     Supine to sit: Mod assist Sit to supine: Mod assist   General bed mobility comments: slow labored movement with difficulty propping up on elbows  Transfers Overall transfer level: Needs assistance Equipment used: Rolling walker (2 wheeled) Transfers: Sit to/from Stand Sit to Stand: Mod assist         General transfer comment: slow labored movement  Ambulation/Gait Ambulation/Gait assistance: Mod assist;Max assist Gait Distance (Feet): 3 Feet Assistive device: Rolling walker (2 wheeled) Gait Pattern/deviations: Decreased step length - right;Decreased step length - left;Decreased stance time - left;Decreased stride length;Antalgic Gait velocity: slow   General Gait Details: limited to 3-4  slow labored side steps with assistance to move LLE due to increased pain, frequent leaning/falling backwards  Stairs            Wheelchair Mobility    Modified Rankin (Stroke Patients Only)       Balance Overall balance assessment: Needs assistance Sitting-balance support: Bilateral upper extremity supported;Feet supported Sitting balance-Leahy Scale: Fair Sitting balance - Comments: seated at EOB   Standing balance support: Bilateral upper extremity supported;During functional activity Standing balance-Leahy Scale: Poor Standing balance comment: using RW                             Pertinent Vitals/Pain Pain Assessment: Faces Faces Pain Scale: Hurts even more Pain Location: top of left knee and anterior thigh Pain Descriptors / Indicators: Grimacing;Guarding;Sore Pain Intervention(s): Limited activity within patient's tolerance;Monitored during session;Repositioned    Home Living Family/patient expects to be discharged to:: Private residence Living Arrangements: Non-relatives/Friends Available Help at Discharge: Personal care attendant;Available 24 hours/day Type of Home: House Home Access: Ramped entrance     Home Layout: One level Home Equipment: Walker - 2 wheels;Bedside commode;Shower seat;Wheelchair - manual;Hospital bed      Prior Function Level of Independence: Needs assistance   Gait / Transfers Assistance Needed: household ambulator using RW  ADL's / Homemaking Assistance Needed: home aides 24/7        Hand Dominance        Extremity/Trunk Assessment   Upper Extremity Assessment Upper Extremity Assessment: Generalized weakness    Lower Extremity Assessment Lower Extremity Assessment: Generalized weakness;LLE deficits/detail LLE Deficits / Details:  grossly -3/5 LLE: Unable to fully assess due to pain LLE Sensation: WNL LLE Coordination: WNL    Cervical / Trunk Assessment Cervical / Trunk Assessment: Normal  Communication    Communication: No difficulties  Cognition Arousal/Alertness: Awake/alert Behavior During Therapy: WFL for tasks assessed/performed Overall Cognitive Status: Within Functional Limits for tasks assessed                                        General Comments      Exercises     Assessment/Plan    PT Assessment Patient needs continued PT services  PT Problem List Decreased strength;Decreased range of motion;Decreased activity tolerance;Decreased balance;Decreased mobility;Pain       PT Treatment Interventions DME instruction;Gait training;Stair training;Functional mobility training;Therapeutic activities;Therapeutic exercise;Balance training;Patient/family education    PT Goals (Current goals can be found in the Care Plan section)  Acute Rehab PT Goals Patient Stated Goal: return home after rehab with caregivers to assist PT Goal Formulation: With patient/family Time For Goal Achievement: 04/16/20 Potential to Achieve Goals: Good    Frequency Min 2X/week   Barriers to discharge        Co-evaluation               AM-PAC PT "6 Clicks" Mobility  Outcome Measure Help needed turning from your back to your side while in a flat bed without using bedrails?: A Lot Help needed moving from lying on your back to sitting on the side of a flat bed without using bedrails?: A Lot Help needed moving to and from a bed to a chair (including a wheelchair)?: A Lot Help needed standing up from a chair using your arms (e.g., wheelchair or bedside chair)?: A Lot Help needed to walk in hospital room?: Total Help needed climbing 3-5 steps with a railing? : Total 6 Click Score: 10    End of Session   Activity Tolerance: Patient tolerated treatment well;Patient limited by fatigue;Patient limited by pain Patient left: in bed;with call bell/phone within reach;with family/visitor present Nurse Communication: Mobility status PT Visit Diagnosis: Unsteadiness on feet  (R26.81);Other abnormalities of gait and mobility (R26.89);Muscle weakness (generalized) (M62.81)    Time: 3295-1884 PT Time Calculation (min) (ACUTE ONLY): 22 min   Charges:   PT Evaluation $PT Eval Moderate Complexity: 1 Mod PT Treatments $Therapeutic Activity: 23-37 mins        2:58 PM, 04/02/20 Lonell Grandchild, MPT Physical Therapist with [facility] [phone] office [phone] mobile phone

## 2020-04-02 NOTE — TOC Initial Note (Signed)
Transition of Care [facility]) - Initial/Assessment Note    Patient Details  Name: Deborah Vincent MRN: [id number] Date of Birth: June 20, 1946  [facility] CM/SW Contact:    Deborah Pancoast, RN Phone Number: 04/02/2020, 12:17 PM  Clinical Narrative:                 Deborah Vincent to patient Caregiver, Deborah Vincent and confirmed patient had previous knee replacement and discharged home where she has not been successful. Patient has parkinson's which is managed well with medication however was unable to walk for the last 9 months due to knee pain. Patient has 24/7 caregivers who assist as needed. Patient is requesting placement into SNF and prefers [place] or [place] area. RN CM requested PT eval in order to send out bed request and will begin SNF insurance authorization once bed offer accepted.   Expected Discharge Plan: Skilled Nursing Facility Barriers to Discharge: SNF Pending bed offer   Patient Goals and CMS Choice Patient states their goals for this hospitalization and ongoing recovery are:: Seeking SNF placement      Expected Discharge Plan and Services Expected Discharge Plan: Deborah Vincent       Living arrangements for the past 2 months: Single Family Home                                      Prior Living Arrangements/Services Living arrangements for the past 2 months: Single Family Home Lives with:: Self, Other (Comment) (24/7 paid caregivers)              Current home services: DME (scooter, BSC, grab bars, wheelchair)    Activities of Daily Living      Permission Sought/Granted                  Emotional Assessment              Admission diagnosis:  Fall Patient Active Problem List   Diagnosis Date Noted   Chronic cholecystitis with calculus 06/13/2019   MDD (major depressive disorder), recurrent episode, mild ([place]) 02/05/2019   Parkinson's disease ([place]) 04/18/2018   Low back pain 04/18/2018   Hypertensive  disorder 04/18/2018   Depressive disorder 04/18/2018   Osteoarthritis of left knee 04/18/2018   PCP:  Deborah Squibb, MD Pharmacy:   [id number] - [place], [place] [address] Phone: [phone] Fax: [place], [place] Deborah Vincent [place] [address] Phone: [phone] Fax: [phone]     Social Determinants of Health (SDOH) Interventions    Readmission Risk Interventions No flowsheet data found.

## 2020-04-02 NOTE — Plan of Care (Signed)
  Problem: Acute Rehab PT Goals(only PT should resolve) Goal: Pt Will Go Supine/Side To Sit Outcome: Progressing Flowsheets (Taken 04/02/2020 1459) Pt will go Supine/Side to Sit: with minimal assist Goal: Patient Will Transfer Sit To/From Stand Outcome: Progressing Flowsheets (Taken 04/02/2020 1459) Patient will transfer sit to/from stand:  with minimal assist  with moderate assist Goal: Pt Will Transfer Bed To Chair/Chair To Bed Outcome: Progressing Flowsheets (Taken 04/02/2020 1459) Pt will Transfer Bed to Chair/Chair to Bed:  with min assist  with mod assist Goal: Pt Will Ambulate Outcome: Progressing Flowsheets (Taken 04/02/2020 1459) Pt will Ambulate:  25 feet  with moderate assist  with rolling walker   3:00 PM, 04/02/20 Lonell Grandchild, MPT Physical Therapist with [facility] [phone] office [phone] mobile phone

## 2020-04-02 NOTE — ED Notes (Signed)
Replaced pur-wick canister - 1000 ml urine

## 2020-04-03 MED ORDER — ALPRAZOLAM 0.5 MG PO TABS: 0.5000 mg | ORAL_TABLET | Freq: Three times a day (TID) | ORAL | 0 refills | Status: DC | PRN

## 2020-04-03 MED ORDER — OXYCODONE-ACETAMINOPHEN 5-325 MG PO TABS: 1.0000 | ORAL_TABLET | ORAL | 0 refills | Status: DC | PRN

## 2020-04-03 NOTE — ED Notes (Signed)
Pt placed on bedpan but no BM produced

## 2020-04-03 NOTE — TOC Progression Note (Signed)
Transition of Care [facility]) - Progression Note    Patient Details  Name: Deborah Vincent MRN: [id number] Date of Birth: 01-Nov-1945  Transition of Care [facility]) CM/SW Contact  Salome Arnt, [place] Phone Number: 04/03/2020, 11:00 AM  Clinical Narrative:  LCSW followed up with pt's daughter with bed offers. Bed offer at [facility] accepted. Family asked about staying overnight at SNF, but are aware that visiting hours are 10-8 only per facility. LCSW started SNF authorization and faxed clinicals. LCSW updated EDP. Awaiting authorization for transfer to [facility].     Expected Discharge Plan: [place] Barriers to Discharge: SNF Pending bed offer  Expected Discharge Plan and Services Expected Discharge Plan: [place] arrangements for the past 2 months: Single Family Home                                       Social Determinants of Health (SDOH) Interventions    Readmission Risk Interventions No flowsheet data found.

## 2020-04-03 NOTE — ED Provider Notes (Signed)
Patient has remained stable while in the ED. She is medically stable for transfer/discharge for placement. She needs skilled therapy and is not appropriate to be discharged home.  Diet Orders (From admission, onward)    Start     Ordered   04/02/20 0817  Diet Heart Room service appropriate? Yes; Fluid consistency: Thin  Diet effective now       Question Answer Comment  Room service appropriate? Yes   Fluid consistency: Thin      04/02/20 0816            Current Facility-Administered Medications:  .  acetaminophen (TYLENOL) tablet 650 mg, 650 mg, Oral, Daily PRN, Fransico Meadow, PA-C, 650 mg at 04/03/20 2620 .  ALPRAZolam Duanne Moron) tablet 0.5 mg, 0.5 mg, Oral, TID PRN, Sofia, Leslie K, PA-C, 0.5 mg at 04/02/20 1744 .  amoxicillin-clavulanate (AUGMENTIN) 500-125 MG per tablet 500 mg, 1 tablet, Oral, Q8H, Sofia, Leslie K, PA-C, 500 mg at 04/03/20 3559 .  anastrozole (ARIMIDEX) tablet 1 mg, 1 mg, Oral, QHS, Sofia, Leslie K, [place], 1 mg at 04/02/20 2152 .  aspirin EC tablet 325 mg, 325 mg, Oral, Daily, Fransico Meadow, PA-C, 325 mg at 04/03/20 1023 .  buPROPion [facility]) tablet 100 mg, 100 mg, Oral, BID, Sofia, Leslie K, PA-C, 100 mg at 04/03/20 1020 .  carbidopa-levodopa (SINEMET CR) 50-200 MG per tablet controlled release 1 tablet, 1 tablet, Oral, QHS, Sofia, Leslie K, [place], 1 tablet at 04/03/20 7416 .  carbidopa-levodopa (SINEMET IR) 25-100 MG per tablet immediate release 1 tablet, 1 tablet, Oral, 5 times per day, Rancour, Annie Main, MD, 1 tablet at 04/03/20 1023 .  docusate sodium (COLACE) capsule 100 mg, 100 mg, Oral, Daily, Sofia, Leslie K, PA-C, 100 mg at 04/03/20 1023 .  entacapone (COMTAN) tablet 200 mg, 200 mg, Oral, TID, Sofia, Leslie K, PA-C, 200 mg at 04/03/20 1023 .  lisinopril (ZESTRIL) tablet 2.5 mg, 2.5 mg, Oral, Daily, Sofia, Leslie K, PA-C, 2.5 mg at 04/03/20 1017 .  oxyCODONE-acetaminophen (PERCOCET/ROXICET) 5-325 MG per tablet 1 tablet, 1 tablet, Oral, Q4H PRN, Fransico Meadow, [place], 1 tablet at 04/03/20 0929 .  tamsulosin (FLOMAX) capsule 0.4 mg, 0.4 mg, Oral, QPC supper, Sofia, Leslie K, [place], 0.4 mg at 04/02/20 1744  Current Outpatient Medications:  .  acetaminophen (TYLENOL) 325 MG tablet, Take 650 mg by mouth daily as needed for mild pain or moderate pain. , Disp: , Rfl:  .  ALPRAZolam (XANAX) 0.5 MG tablet, Take 0.5 mg by mouth 3 (three) times daily as needed for anxiety., Disp: , Rfl:  .  amoxicillin-clavulanate (AUGMENTIN) 500-125 MG tablet, Take 1 tablet by mouth in the morning and at bedtime. 7 day course starting on 03/27/2020, Disp: , Rfl:  .  anastrozole (ARIMIDEX) 1 MG tablet, Take 1 mg by mouth at bedtime. , Disp: , Rfl:  .  aspirin EC 325 MG tablet, Take 325 mg by mouth in the morning and at bedtime., Disp: , Rfl:  .  buPROPion (WELLBUTRIN) 100 MG tablet, Take 100 mg by mouth 2 (two) times daily., Disp: , Rfl:  .  carbidopa-levodopa (SINEMET CR) 50-200 MG tablet, Take 1 tablet by mouth at bedtime., Disp: 90 tablet, Rfl: 1 .  carbidopa-levodopa (SINEMET IR) 25-100 MG tablet, Take 1 tablet by mouth 5 (five) times daily. 7 AM/10 AM/1 PM/4 PM/7 PM, Disp: 540 tablet, Rfl: 1 .  Cholecalciferol (VITAMIN D3) 125 MCG (5000 UT) TBDP, Take 5,000 Units by mouth daily., Disp: , Rfl:  .  diclofenac sodium (VOLTAREN) 1 % GEL, Apply 1 application topically 4 (four) times daily as needed (knee pain). , Disp: , Rfl:  .  Docusate Calcium (STOOL SOFTENER PO), Take by mouth in the morning, at noon, in the evening, and at bedtime., Disp: , Rfl:  .  entacapone (COMTAN) 200 MG tablet, Take 1 tablet (200 mg total) by mouth 3 (three) times daily. (Patient taking differently: Take 200 mg by mouth 3 (three) times daily. Takes with 1st 3 doses of Sinemet (7q, 10a, and 1pm)), Disp: 270 tablet, Rfl: 1 .  Lidocaine 4 % PTCH, Apply 1 patch topically 2 (two) times daily as needed (pain)., Disp: , Rfl:  .  lisinopril (ZESTRIL) 5 MG tablet, Take 2.5 mg by mouth in the morning and at  bedtime. , Disp: , Rfl:  .  nystatin cream (MYCOSTATIN), Apply 1 application topically 2 (two) times daily as needed for dry skin., Disp: , Rfl:  .  oxyCODONE-acetaminophen (PERCOCET/ROXICET) 5-325 MG tablet, Take 1 tablet by mouth every 4 (four) hours as needed for moderate pain or severe pain. , Disp: , Rfl:  .  tamsulosin (FLOMAX) 0.4 MG CAPS capsule, Take 0.4 mg by mouth daily after supper. , Disp: , Rfl:  .  phenylephrine-shark liver oil-mineral oil-petrolatum (PREPARATION H) 0.25-3-14-71.9 % rectal ointment, Place 1 application rectally 2 (two) times daily as needed for hemorrhoids., Disp: , RflSherwood Gambler, MD 04/03/20 1036

## 2020-04-03 NOTE — TOC Transition Note (Signed)
Transition of Care [facility]) - CM/SW Discharge Note   Patient Details  Name: Deborah Vincent MRN: [id number] Date of Birth: 03-26-1946  Transition [facility]) CM/SW Contact:  Natasha Bence, LCSW Phone Number: 04/03/2020, 2:40 PM   Clinical Narrative:    Patient has been authorized by Bernadene Bell for [facility] placement upon discharge. Auth # [id number] Patient agreeable to [facility] placement. Patient's family has been notified of discharge. [facility] requested prescription of controlled medications. CSW requested prescription from Dr. Sherwood Gambler. Nurse to schedule EMS transport. TOC signing off.      Barriers to Discharge: SNF Pending bed offer   Patient Goals and CMS Choice Patient states their goals for this hospitalization and ongoing recovery are:: Seeking SNF placement      Discharge Placement  [facility]               Readmission Risk Interventions No flowsheet data found.

## 2020-06-19 ENCOUNTER — Ambulatory Visit: Payer: Medicare Other | Admitting: Neurology

## 2020-06-19 NOTE — Progress Notes (Signed)
Assessment/Plan:   1.  Parkinsons Disease  -looks pretty good today and congratulated her!  So glad to see her in person and out of bed!  -Continue carbidopa/levodopa 25/100, 1 tablet at 6am/9am/noon/3pm/6pm/9pm.  Pt understands not to change dosage in future without letting me know/approve  -Continue carbidopa/levodopa 50/200 but she is taking that at midnight -  Thinks less cramping that way  -Continue rasagiline, 1 mg daily  -continue entacapone, 200 mg, 1 tablet with first 3 dosages of carbidopa/levodopa 25/100  2.  Gait instability  -Partially due to #1 and due to lack of exercise/activity but actually better today now that s/p knee surgery and in PT  -I do think that medications play a role.  As previous, I do not recommend Xanax, which I think can contribute to this, along with the risk for cognitive change.  PDMP reviewed.  She is taking number 90/month   -Patient also receiving pain meds which I think could be playing a role as well.  She received #26 vicodin on 7/22 (post surgery), #28 tramadol on 8/2, #28 tramadol on 8/13 and #120 tramadol on August 26.  It is noted that orthopedics was trying to get her off of the medication, declined the last refill, but she ended up calling primary care for the refill of #120.  I would like to see her off of these medicines as well.  We discussed that today.  She states using rarely  3.  Depression  -Has been a great influence her in physical and mental health.    -talked with patient about counseling and states that she is doing some counseling now.  Talked with her about psychiatry and she reports doing well right now and feels comfortable with PCP as RX for mood meds   4.  Chronic nausea and constipation  -We have been able to prove that the nausea is not from Parkinson's or its medications.  Medications have been completely reworked without success.  -Has been on Linzess in the past for constipation.  5.  Urinary retention  -Follows  with urology.  On Flomax   Subjective:   Deborah Vincent was seen today in follow up for Parkinsons disease.  My previous records were reviewed prior to todays visit as well as outside records available to me.  Pt with caregiverwho supplements hx.  We started bedtime CR dose of levodopa last visit to help with the cramping.  She reports that it helps but works best if she takes at midnight.pt had knee surgery since our last visit.  Those notes are reviewed.  She has been following with Dr. Len Childs.  She has been on pain medication.  Last notes from Dr. Len Childs indicate that he is trying to get her off of that and he told her to wean off of the tramadol and was going to try to call in [place].  Patient did call him back for refill of tramadol and it was declined, so she instead called her primary care and got a refill there.  She is in PT now.  She is ambulating with the walker but PT is planning on moving to the cane.  She has caregivers in the home full time.  No hallucinations.  No syncope/near syncope.  Current prescribed movement disorder medications: carbidopa/levodopa 25/100, 1 tablet at 7 AM/10 AM/1 PM/4 PM/7 PM (they report that she is taking 6 times per day) - pt taking at 6am/9am/noon/3pm/6pm/9pm Carbidopa/levodopa 50/200 at bedtime (pt now taking this at  midnight) Entacapone 200 mg, 1 tablet with first 3 dosages of levodopa   ALLERGIES:   Allergies  Allergen Reactions   Nubain [Nalbuphine Hcl] Anaphylaxis   Ciprofloxacin Hives   Macrobid [Nitrofurantoin Macrocrystal] Nausea Only   Naproxen Hives   Sulfa Antibiotics Hives and Itching   Vicodin [Hydrocodone-Acetaminophen] Other (See Comments)    Overuse in past-prefers not to take   Other Other (See Comments) and Rash    PULLS SKIN OFF ; PAPER TAPE OK TO USE    CURRENT MEDICATIONS:  Outpatient Encounter Medications as of 06/26/2020  Medication Sig   acetaminophen (TYLENOL) 325 MG tablet Take 650 mg by mouth daily as  needed for mild pain or moderate pain.    ALPRAZolam (XANAX) 0.5 MG tablet Take 1 tablet (0.5 mg total) by mouth 3 (three) times daily as needed for anxiety.   anastrozole (ARIMIDEX) 1 MG tablet Take 1 mg by mouth at bedtime.    aspirin EC 325 MG tablet Take 325 mg by mouth in the morning and at bedtime.   buPROPion (WELLBUTRIN) 100 MG tablet Take 100 mg by mouth 2 (two) times daily.   carbidopa-levodopa (SINEMET CR) 50-200 MG tablet TAKE 1 TABLET BY MOUTH EVERYDAY AT BEDTIME   carbidopa-levodopa (SINEMET IR) 25-100 MG tablet Take 1 tablet by mouth 5 (five) times daily. 7 AM/10 AM/1 PM/4 PM/7 PM (Patient taking differently: Take 1 tablet by mouth 5 (five) times daily. 6 AM/9 AM/12 PM/3 PM/6 PM/9pm)   diclofenac sodium (VOLTAREN) 1 % GEL Apply 1 application topically 4 (four) times daily as needed (knee pain).    Docusate Calcium (STOOL SOFTENER PO) Take by mouth in the morning, at noon, in the evening, and at bedtime.   entacapone (COMTAN) 200 MG tablet Take 1 tablet (200 mg total) by mouth 3 (three) times daily. (Patient taking differently: Take 200 mg by mouth 3 (three) times daily. Takes with 1st 3 doses of Sinemet (7q, 10a, and 1pm))   Lidocaine 4 % PTCH Apply 1 patch topically 2 (two) times daily as needed (pain).   lisinopril (ZESTRIL) 5 MG tablet Take 2.5 mg by mouth in the morning and at bedtime.    nystatin cream (MYCOSTATIN) Apply 1 application topically 2 (two) times daily as needed for dry skin.   phenylephrine-shark liver oil-mineral oil-petrolatum (PREPARATION H) 0.25-3-14-71.9 % rectal ointment Place 1 application rectally 2 (two) times daily as needed for hemorrhoids.   tamsulosin (FLOMAX) 0.4 MG CAPS capsule Take 0.4 mg by mouth daily after supper.    [DISCONTINUED] amoxicillin-clavulanate (AUGMENTIN) 500-125 MG tablet Take 1 tablet by mouth in the morning and at bedtime. 7 day course starting on 03/27/2020 (Patient not taking: Reported on 06/26/2020)   [DISCONTINUED]  carbidopa-levodopa (SINEMET CR) 50-200 MG tablet Take 1 tablet by mouth at bedtime.   [DISCONTINUED] Cholecalciferol (VITAMIN D3) 125 MCG (5000 UT) TBDP Take 5,000 Units by mouth daily. (Patient not taking: Reported on 06/26/2020)   [DISCONTINUED] oxyCODONE-acetaminophen (PERCOCET/ROXICET) 5-325 MG tablet Take 1 tablet by mouth every 4 (four) hours as needed for moderate pain or severe pain. (Patient not taking: Reported on 06/26/2020)   No facility-administered encounter medications on file as of 06/26/2020.    Objective:   PHYSICAL EXAMINATION:    VITALS:   Vitals:   06/26/20 1052  Height: 5' 1.5" (1.562 m)    GEN:  The patient appears stated age and is in NAD. HEENT:  Normocephalic, atraumatic.  The mucous membranes are moist. The superficial temporal arteries are without ropiness  or tenderness. CV:  RRR Lungs:  CTAB Neck/HEME:  There are no carotid bruits bilaterally.  Neurological examination:  Orientation: The patient is alert and oriented x3. Cranial nerves: There is good facial symmetry with facial hypomimia. The speech is fluent and clear. Soft palate rises symmetrically and there is no tongue deviation. Hearing is intact to conversational tone. Sensation: Sensation is intact to light touch throughout Motor: Strength is at least antigravity x4.  Movement examination: Tone: There is nl tone in the ue/le Abnormal movements: rare dyskinesia in the L food Coordination:  There is no decremation with RAM's Gait and Station: Patient pushes off of the chair to arise and requires mild assistance from examiner.  She holds examiners hand to ambulate and counts while she ambulates but actually walks quite well.  She is not putting weight on the examiner with ambulation and is pretty steady.  I have reviewed and interpreted the following labs independently    Chemistry      Component Value Date/Time   NA 135 04/01/2020 2151   K 4.0 04/01/2020 2151   CL 97 (L) 04/01/2020 2151    CO2 27 04/01/2020 2151   BUN 15 04/01/2020 2151   CREATININE 0.75 04/01/2020 2151      Component Value Date/Time   CALCIUM 8.6 (L) 04/01/2020 2151   ALKPHOS 78 04/01/2020 2151   AST 11 (L) 04/01/2020 2151   ALT 6 04/01/2020 2151   BILITOT 0.7 04/01/2020 2151       Lab Results  Component Value Date   WBC 9.0 04/01/2020   HGB 11.6 (L) 04/01/2020   HCT 34.6 (L) 04/01/2020   MCV 94.0 04/01/2020   PLT 274 04/01/2020    No results found for: TSH   Total time spent on today's visit was 30 minutes, including both face-to-face time and nonface-to-face time.  Time included that spent on review of records (prior notes available to me/labs/imaging if pertinent), discussing treatment and goals, answering patient's questions and coordinating care.  Cc:  [name], MD

## 2020-06-25 ENCOUNTER — Other Ambulatory Visit: Payer: Self-pay | Admitting: Neurology

## 2020-06-26 ENCOUNTER — Other Ambulatory Visit: Payer: Self-pay

## 2020-06-26 ENCOUNTER — Ambulatory Visit: Payer: Medicare Other | Admitting: Neurology

## 2020-06-26 ENCOUNTER — Encounter: Payer: Self-pay | Admitting: Neurology

## 2020-06-26 VITALS — BP 96/62 | HR 101 | Ht 61.5 in | Wt 180.0 lb

## 2020-06-26 DIAGNOSIS — F411 Generalized anxiety disorder: Secondary | ICD-10-CM

## 2020-06-26 DIAGNOSIS — G20 Parkinson's disease: Secondary | ICD-10-CM

## 2020-06-26 NOTE — Patient Instructions (Addendum)
No changes to your medications for now  Exercise!  The physicians and staff at [facility] are committed to providing excellent care. You may receive a survey requesting feedback about your experience at our office. We strive to receive "very good" responses to the survey questions. If you feel that your experience would prevent you from giving the office a "very good " response, please contact our office to try to remedy the situation. We may be reached at [phone]. Thank you for taking the time out of your busy day to complete the survey.

## 2020-07-27 ENCOUNTER — Emergency Department (HOSPITAL_COMMUNITY)
Admission: EM | Admit: 2020-07-27 | Discharge: 2020-07-27 | Disposition: A | Discharge status: Home / Self Care | Payer: Medicare Other | Attending: Emergency Medicine | Admitting: Emergency Medicine

## 2020-07-27 ENCOUNTER — Other Ambulatory Visit: Payer: Self-pay

## 2020-07-27 ENCOUNTER — Emergency Department (HOSPITAL_COMMUNITY): Payer: Medicare Other

## 2020-07-27 ENCOUNTER — Encounter (HOSPITAL_COMMUNITY): Payer: Self-pay | Admitting: Emergency Medicine

## 2020-07-27 DIAGNOSIS — S96912A Strain of unspecified muscle and tendon at ankle and foot level, left foot, initial encounter: Secondary | ICD-10-CM | POA: Insufficient documentation

## 2020-07-27 DIAGNOSIS — Z853 Personal history of malignant neoplasm of breast: Secondary | ICD-10-CM | POA: Diagnosis not present

## 2020-07-27 DIAGNOSIS — W19XXXA Unspecified fall, initial encounter: Secondary | ICD-10-CM | POA: Diagnosis not present

## 2020-07-27 DIAGNOSIS — S8992XA Unspecified injury of left lower leg, initial encounter: Secondary | ICD-10-CM | POA: Diagnosis present

## 2020-07-27 DIAGNOSIS — Z7982 Long term (current) use of aspirin: Secondary | ICD-10-CM | POA: Diagnosis not present

## 2020-07-27 DIAGNOSIS — Z79899 Other long term (current) drug therapy: Secondary | ICD-10-CM | POA: Insufficient documentation

## 2020-07-27 DIAGNOSIS — I10 Essential (primary) hypertension: Secondary | ICD-10-CM | POA: Insufficient documentation

## 2020-07-27 DIAGNOSIS — G20 Parkinson's disease: Secondary | ICD-10-CM | POA: Diagnosis not present

## 2020-07-27 DIAGNOSIS — Y9389 Activity, other specified: Secondary | ICD-10-CM | POA: Insufficient documentation

## 2020-07-27 DIAGNOSIS — S86912A Strain of unspecified muscle(s) and tendon(s) at lower leg level, left leg, initial encounter: Secondary | ICD-10-CM | POA: Insufficient documentation

## 2020-07-27 DIAGNOSIS — Z96652 Presence of left artificial knee joint: Secondary | ICD-10-CM | POA: Diagnosis not present

## 2020-07-27 MED ORDER — TRAMADOL HCL 50 MG PO TABS: 50.0000 mg | ORAL_TABLET | Freq: Four times a day (QID) | ORAL | 0 refills | Status: DC | PRN

## 2020-07-27 MED ORDER — ONDANSETRON HCL 4 MG/2ML IJ SOLN
4.0000 mg | Freq: Once | INTRAMUSCULAR | Status: AC
  Administered 2020-07-27: 4 mg via INTRAVENOUS
  Filled 2020-07-27: qty 2

## 2020-07-27 MED ORDER — FENTANYL CITRATE (PF) 100 MCG/2ML IJ SOLN
50.0000 ug | Freq: Once | INTRAMUSCULAR | Status: AC
  Administered 2020-07-27: 50 ug via INTRAVENOUS
  Filled 2020-07-27: qty 2

## 2020-07-27 NOTE — ED Provider Notes (Signed)
[facility] EMERGENCY DEPARTMENT Provider Note   CSN: [id number] Arrival date & time: 07/27/20  1916     History Chief Complaint  Patient presents with  . Fall  . Leg Injury    Deborah Vincent is a 74 y.o. female.  Pt presents to the ED today with left knee and left ankle pain after a fall tonight.  Pt had a knee replacement in July by Dr. Len Childs at [facility].  After fall, pt was able to get and walk with her walker.  Pt denied any other injuries.        Past Medical History:  Diagnosis Date  . Breast cancer ([place])    Breast Cancer  . Depression   . Hyperlipidemia   . Hypertension   . Neuropathy   . Osteoarthritis   . Parkinson's disease ([place])   . Urinary tract infection     Patient Active Problem List   Diagnosis Date Noted  . Chronic cholecystitis with calculus 06/13/2019  . MDD (major depressive disorder), recurrent episode, mild ([place]) 02/05/2019  . Parkinson's disease ([place]) 04/18/2018  . Low back pain 04/18/2018  . Hypertensive disorder 04/18/2018  . Depressive disorder 04/18/2018  . Osteoarthritis of left knee 04/18/2018    Past Surgical History:  Procedure Laterality Date  . ABDOMINAL EXPLORATION SURGERY  1999   intestinal adhesions  . ABDOMINAL HYSTERECTOMY  1982  . BREAST LUMPECTOMY Left 04/03/2012  . CHOLECYSTECTOMY N/A 06/13/2019   Procedure: LAPAROSCOPIC CHOLECYSTECTOMY;  Surgeon: Donnie Mesa, MD;  Location: [place];  Service: General;  Laterality: N/A;  . INTRAOPERATIVE CHOLANGIOGRAM N/A 06/13/2019   Procedure: Attempted Intraoperative Cholangiogram;  Surgeon: Donnie Mesa, MD;  Location: [place];  Service: General;  Laterality: N/A;  Attempted unable to perform  . LAPAROSCOPIC LYSIS OF ADHESIONS N/A 06/13/2019   Procedure: Laparoscopic Lysis Of Adhesions;  Surgeon: Donnie Mesa, MD;  Location: [place];  Service: General;  Laterality: N/A;  . left mastectomy    . MASTECTOMY Left 04/28/2012  . RENAL ANGIOPLASTY Left 1992  . REVISION TOTAL KNEE  ARTHROPLASTY       OB History   No obstetric history on file.     Family History  Problem Relation Age of Onset  . Cancer Mother        GI   . Hypertension Mother   . Depression Mother   . Alcohol abuse Mother   . Lung cancer Father   . Alcohol abuse Father   . Other Sister        Wagner's granuloma    Social History   Tobacco Use  . Smoking status: Never Smoker  . Smokeless tobacco: Never Used  Vaping Use  . Vaping Use: Never used  Substance Use Topics  . Alcohol use: No    Comment: prior alcohol dependence  . Drug use: Not Currently    Types: Marijuana    Comment: in the 1960's    Home Medications Prior to Admission medications   Medication Sig Start Date End Date Taking? Authorizing Provider  acetaminophen (TYLENOL) 325 MG tablet Take 650 mg by mouth daily as needed for mild pain or moderate pain.     [provider]  ALPRAZolam Duanne Moron) 0.5 MG tablet Take 1 tablet (0.5 mg total) by mouth 3 (three) times daily as needed for anxiety. 04/03/20   Sherwood Gambler, MD  anastrozole (ARIMIDEX) 1 MG tablet Take 1 mg by mouth at bedtime.     [provider]  aspirin EC 325 MG  tablet Take 325 mg by mouth in the morning and at bedtime.    [provider]  buPROPion (WELLBUTRIN) 100 MG tablet Take 100 mg by mouth 2 (two) times daily.    [provider]  carbidopa-levodopa (SINEMET CR) 50-200 MG tablet TAKE 1 TABLET BY MOUTH EVERYDAY AT BEDTIME 06/25/20   Tat, Rebecca S, DO  carbidopa-levodopa (SINEMET IR) 25-100 MG tablet Take 1 tablet by mouth 5 (five) times daily. 7 AM/10 AM/1 PM/4 PM/7 PM Patient taking differently: Take 1 tablet by mouth 5 (five) times daily. 6 AM/9 AM/12 PM/3 PM/6 PM/9pm 01/31/20   Tat, Eustace Quail, DO  diclofenac sodium (VOLTAREN) 1 % GEL Apply 1 application topically 4 (four) times daily as needed (knee pain).     [provider]  Docusate Calcium (STOOL SOFTENER PO) Take by mouth in the morning, at noon, in the  evening, and at bedtime.    [provider]  entacapone (COMTAN) 200 MG tablet Take 1 tablet (200 mg total) by mouth 3 (three) times daily. Patient taking differently: Take 200 mg by mouth 3 (three) times daily. Takes with 1st 3 doses of Sinemet (7q, 10a, and 1pm) 01/31/20   Tat, Eustace Quail, DO  Lidocaine 4 % PTCH Apply 1 patch topically 2 (two) times daily as needed (pain).    [provider]  lisinopril (ZESTRIL) 5 MG tablet Take 2.5 mg by mouth in the morning and at bedtime.     [provider]  nystatin cream (MYCOSTATIN) Apply 1 application topically 2 (two) times daily as needed for dry skin.    [provider]  phenylephrine-shark liver oil-mineral oil-petrolatum (PREPARATION H) 0.25-3-14-71.9 % rectal ointment Place 1 application rectally 2 (two) times daily as needed for hemorrhoids.    [provider]  tamsulosin (FLOMAX) 0.4 MG CAPS capsule Take 0.4 mg by mouth daily after supper.  01/10/20   [provider]  traMADol (ULTRAM) 50 MG tablet Take 1 tablet (50 mg total) by mouth every 6 (six) hours as needed. 07/27/20   Isla Pence, MD    Allergies    Nubain [nalbuphine hcl], Ciprofloxacin, Macrobid [nitrofurantoin macrocrystal], Naproxen, Sulfa antibiotics, Vicodin [hydrocodone-acetaminophen], and Other  Review of Systems   Review of Systems  Musculoskeletal:       Knee, ankle pain  All other systems reviewed and are negative.   Physical Exam Updated Vital Signs BP (!) 141/87 (BP Location: Right Arm)   Pulse 93   Temp 98.4 F (36.9 C) (Oral)   Resp 20   Ht 5\' 2"  (1.575 m)   Wt 79.4 kg   SpO2 95%   BMI 32.01 kg/m   Physical Exam Vitals and nursing note reviewed.  Constitutional:      Appearance: Normal appearance. She is obese.  HENT:     Head: Normocephalic and atraumatic.     Right Ear: External ear normal.     Left Ear: External ear normal.     Nose: Nose normal.     Mouth/Throat:     Mouth: Mucous membranes  are moist.     Pharynx: Oropharynx is clear.  Eyes:     Extraocular Movements: Extraocular movements intact.     Conjunctiva/sclera: Conjunctivae normal.     Pupils: Pupils are equal, round, and reactive to light.  Cardiovascular:     Rate and Rhythm: Normal rate and regular rhythm.     Pulses: Normal pulses.     Heart sounds: Normal heart sounds.  Pulmonary:  Effort: Pulmonary effort is normal.     Breath sounds: Normal breath sounds.  Abdominal:     General: Abdomen is flat. Bowel sounds are normal.     Palpations: Abdomen is soft.  Musculoskeletal:     Cervical back: Normal range of motion and neck supple.       Legs:  Skin:    General: Skin is warm.     Capillary Refill: Capillary refill takes less than 2 seconds.  Neurological:     General: No focal deficit present.     Mental Status: She is alert and oriented to person, place, and time.  Psychiatric:        Mood and Affect: Mood normal.        Behavior: Behavior normal.        Thought Content: Thought content normal.        Judgment: Judgment normal.     ED Results / Procedures / Treatments   Labs (all labs ordered are listed, but only abnormal results are displayed) Labs Reviewed - No data to display  EKG None  Radiology DG Ankle Complete Left  Result Date: 07/27/2020 CLINICAL DATA:  Status post fall. EXAM: LEFT ANKLE COMPLETE - 3+ VIEW COMPARISON:  None. FINDINGS: A very small fracture deformity of indeterminate age is seen involving the proximal tip of the fifth left metatarsal. The osseous structures of the left ankle are intact. There is no evidence of dislocation. Mild medial soft tissue swelling is seen. IMPRESSION: Very small fracture deformity of indeterminate age involving the proximal tip of the fifth left metatarsal. Correlation with point tenderness is recommended. Electronically Signed   By: Virgina Norfolk M.D.   On: 07/27/2020 19:52   DG Knee Complete 4 Views Left  Result Date:  07/27/2020 CLINICAL DATA:  Status post fall. EXAM: LEFT KNEE - COMPLETE 4+ VIEW COMPARISON:  April 01, 2020 FINDINGS: A left knee replacement is seen. There is no evidence of surrounding lucency to suggest the presence of hardware loosening or infection. There is no evidence of acute fracture or dislocation. A large joint effusion is seen. IMPRESSION: 1. Left knee replacement without evidence of acute fracture or dislocation. 2. Large joint effusion. Electronically Signed   By: Virgina Norfolk M.D.   On: 07/27/2020 19:50    Procedures Procedures (including critical care time)  Medications Ordered in ED Medications  ondansetron (ZOFRAN) injection 4 mg (4 mg Intravenous Given 07/27/20 2008)  fentaNYL (SUBLIMAZE) injection 50 mcg (50 mcg Intravenous Given 07/27/20 2008)    ED Course  I have reviewed the triage vital signs and the nursing notes.  Pertinent labs & imaging results that were available during my care of the patient were reviewed by me and considered in my medical decision making (see chart for details).    MDM Rules/Calculators/A&P                          Pt has no pain to her left 5th metatarsal.  Pt is able to ambulate.  She is to f/u with her orthopedist.  She has a knee splint and walker at home.  Return if worse.  Final Clinical Impression(s) / ED Diagnoses Final diagnoses:  Knee strain, left, initial encounter  Ankle strain, left, initial encounter  Fall, initial encounter    Rx / DC Orders ED Discharge Orders         Ordered    traMADol (ULTRAM) 50 MG tablet  Every 6 hours  PRN        07/27/20 2100           Isla Pence, MD 07/27/20 2110

## 2020-07-27 NOTE — ED Triage Notes (Signed)
L knee replacement in July  Tonight changing clothing w assistance  [place] while putting leg in pants  Fell- no LOC  Complaint of L knee and L ankle pain

## 2020-07-27 NOTE — Discharge Instructions (Signed)
Use your walker and your knee brace as needed.

## 2020-08-18 ENCOUNTER — Telehealth: Payer: Self-pay | Admitting: Neurology

## 2020-08-18 DIAGNOSIS — G20 Parkinson's disease: Secondary | ICD-10-CM

## 2020-08-18 NOTE — Telephone Encounter (Addendum)
Left message for patient informing him that Dr Tat is agreeable with PT and the referral has been sent. Advised the patient to contact our office with any questions or concerns.

## 2020-08-18 NOTE — Telephone Encounter (Signed)
Patient is requesting a referral for PT for her balance to [facility] in [place] ph # [phone]  Fax # [phone]. Please call.

## 2020-08-18 NOTE — Telephone Encounter (Signed)
Is this ok?

## 2020-08-18 NOTE — Telephone Encounter (Signed)
ok 

## 2020-08-19 ENCOUNTER — Other Ambulatory Visit: Payer: Self-pay | Admitting: Neurology

## 2020-08-19 NOTE — Telephone Encounter (Signed)
Rx(s) sent to pharmacy electronically.  

## 2020-08-26 ENCOUNTER — Other Ambulatory Visit: Payer: Self-pay

## 2020-08-26 ENCOUNTER — Ambulatory Visit (INDEPENDENT_AMBULATORY_CARE_PROVIDER_SITE_OTHER): Payer: Medicare Other | Admitting: Clinical

## 2020-08-26 DIAGNOSIS — F419 Anxiety disorder, unspecified: Secondary | ICD-10-CM

## 2020-08-26 DIAGNOSIS — F331 Major depressive disorder, recurrent, moderate: Secondary | ICD-10-CM
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
## 2020-09-05 ENCOUNTER — Other Ambulatory Visit: Payer: Self-pay | Admitting: Neurology

## 2020-09-05 NOTE — Telephone Encounter (Signed)
Left detailed message stating no rx needed to be sent to the pharmacy because they have one on file and will get it ready for pickup, ok per DPR, and to call back if any questions.

## 2020-09-05 NOTE — Telephone Encounter (Signed)
Patient's caregiver called and left a message to check on the status of the request from the pharmacy for carbidopa levodopa 50-200 MG.

## 2020-09-08 NOTE — Telephone Encounter (Signed)
Spoke with Katrina, the care giver, per DPR, and she stated the patient picked up the rx on Saturday and thanked me for calling.

## 2020-09-22 ENCOUNTER — Other Ambulatory Visit: Payer: Self-pay

## 2020-09-22 ENCOUNTER — Ambulatory Visit (HOSPITAL_COMMUNITY): Payer: Medicare Other | Admitting: Clinical

## 2020-10-13 ENCOUNTER — Other Ambulatory Visit: Payer: Self-pay

## 2020-10-13 ENCOUNTER — Ambulatory Visit (INDEPENDENT_AMBULATORY_CARE_PROVIDER_SITE_OTHER): Payer: Medicare Other | Admitting: Clinical

## 2020-10-13 DIAGNOSIS — F419 Anxiety disorder, unspecified: Secondary | ICD-10-CM | POA: Diagnosis not present

## 2020-10-13 DIAGNOSIS — F331 Major depressive disorder, recurrent, moderate: Secondary | ICD-10-CM | POA: Diagnosis not present

## 2020-10-13 NOTE — Progress Notes (Signed)
Virtual Visit via Telephone Note  I connected withMargaret P. Vincent 1/10/22at 2:00 PM ESTby telephoneand verified that I am speaking with the correct person using two identifiers.  Location: Patient:Home Provider:Office  I discussed the limitations, risks, security and privacy concerns of performing an evaluation and management service by telephone and the availability of in person appointments. I also discussed with the patient that there may be a patient responsible charge related to this service. The patient expressed understanding and agreed to proceed.     THERAPIST PROGRESS NOTE  Session Time:2:00PM-2:45PM  Participation Level:Active  Behavioral Response:CasualAlertDepressed  Type of Therapy:Individual Therapy  Treatment Goals addressed:Coping  Interventions:CBT, DBT, Motivational Interviewing, Solution Focused, Strength-based and Supportive  Summary:Deborah Vincent a 74y.o.femalewho presents with Depressionand Anxiety.The OPT therapist worked with thepatientfor herinitialOPT treatment. The OPT therapist utilized Motivational Interviewing to assist in creating therapeutic repore. The patient in the session was engaged and work in Science writer about hertriggers and symptoms over the past few weeks includingongoing dealing with complications of Parkinsons.The OPT therapist utilized Cognitive Behavioral Therapy through cognitive restructuring as well as worked with the patient on coping strategies to assistmoving forwardincluding reinvesting her time to  beinging active with her health and hobbies.OPT therapist encouraged involvement and for the patient to try not to use her support systemand remain active within her physical limits.The OPT therapist for holistic care inquired with the patient about her medication therapythe patientis still looking for a fit with her psychiatry.  Suicidal/Homicidal:Nowithout  intent/plan  Therapist Response:The OPT therapist worked with the patient for the patients scheduled session. The patient was engaged in hersession and gave feedback in relation to triggers, symptoms, and behavior responses over the past fewweeks. The OPT therapist worked with the patient utilizing an in session Cognitive Behavioral Therapy exercise. The patient was responsive in the session andtalked about herintent to take time to continue working on steps to move forward with her physical health and stamina.The OPT therapist worked with the patient onusing protective factors and coping strategies as she goes through PT and working to be as independent as possible with in home tasks..The OPT therapist will continue treatment work with the patient in hernext scheduled session.  Plan: Return again in2/3weeks.  Diagnosis:Axis I: Recurrent episode, moderate,depressive disorder with anxiety  Axis II:No diagnosis  I discussed the assessment and treatment plan with the patient. The patient was provided an opportunity to ask questions and all were answered. The patient agreed with the plan and demonstrated an understanding of the instructions.  The patient was advised to call back or seek an in-person evaluation if the symptoms worsen or if the condition fails to improve as anticipated.  I provided52minutes of non-face-to-face time during this encounter.  [name], LCSW  10/13/2020

## 2020-10-23 ENCOUNTER — Other Ambulatory Visit: Payer: Self-pay | Admitting: Neurology

## 2020-10-26 IMAGING — CT CT ANGIOGRAPHY ABDOMEN AND PELVIS WITH CONTRAST AND WITHOUT CONT
3 of 13 series · 10 of 46 positions shown, 13 images · IV contrast (APPLIED)
Comparison: CT abdomen pelvis April 05, 2019, MR abdomen May 03, 2019

CLINICAL DATA: Chronic nausea and abdominal pain for weeks. Concern
for mesenteric ischemia

EXAM:
CTA ABDOMEN AND PELVIS WITHOUT AND WITH CONTRAST
TECHNIQUE: Multidetector CT imaging of the abdomen and pelvis was performed
using the standard protocol during bolus administration of
intravenous contrast. Multiplanar reconstructed images and MIPs were
obtained and reviewed to evaluate the vascular anatomy.
CONTRAST:  100mL OMNIPAQUE IOHEXOL 350 MG/ML SOLN

[Series 11: venous 5.0 i30f 1 · axial · portal-venous · 0.91mm/px · z∈[+726,+726]mm · 1 of 101 slices shown, 3 images]
[im 1/101  soft-tissue]
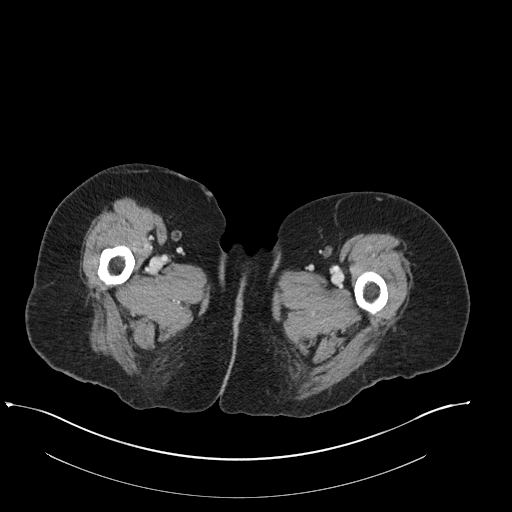
[im 1/101  lung]
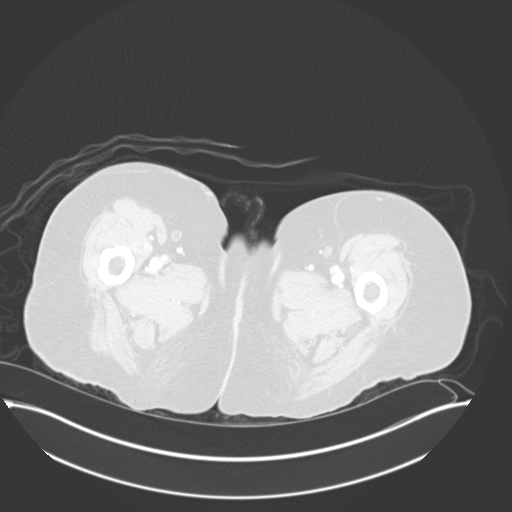
[im 1/101  bone]
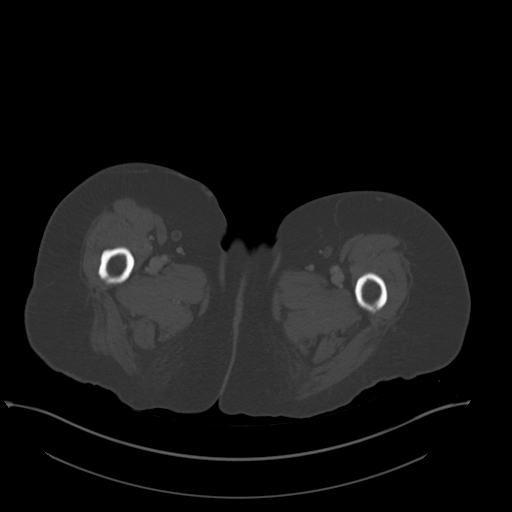

[Series 12: thins · axial · 0.77mm/px · z∈[+965,+1161]mm · 7 of 655 slices shown]
[im 82/655  soft-tissue]
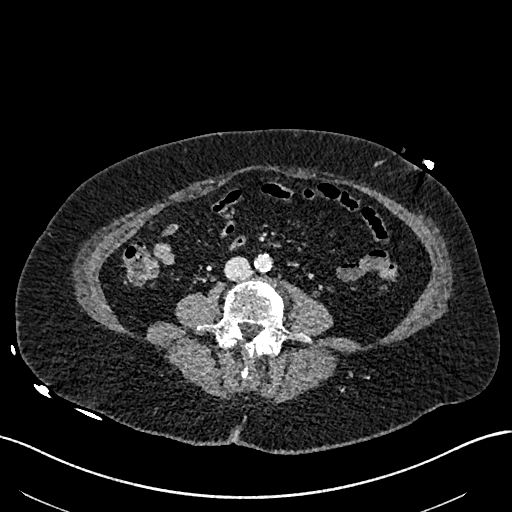
[im 164/655  soft-tissue]
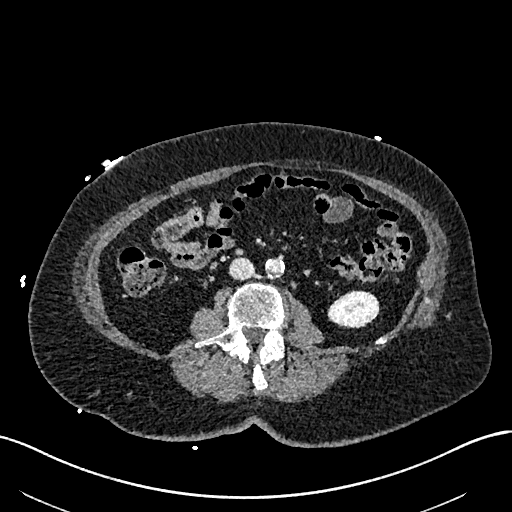
[im 246/655  soft-tissue]
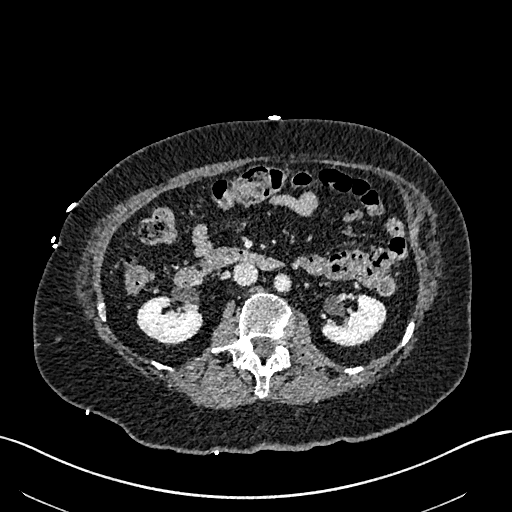
[im 328/655  soft-tissue]
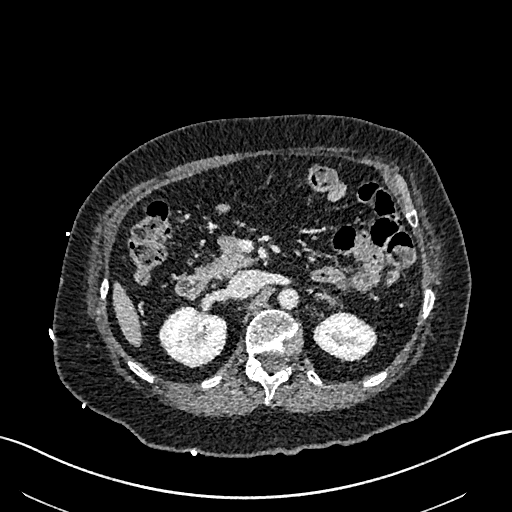
[im 409/655  soft-tissue]
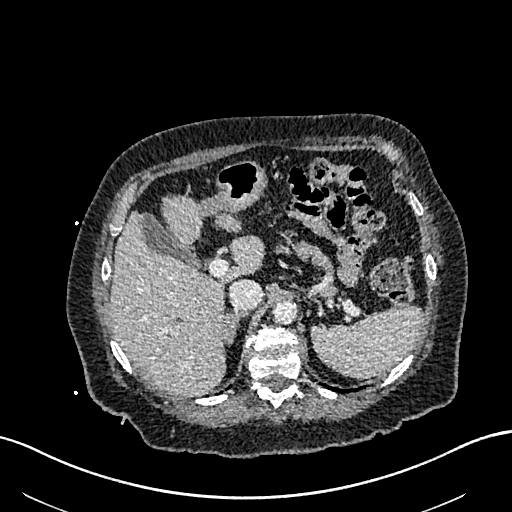
[im 491/655  soft-tissue]
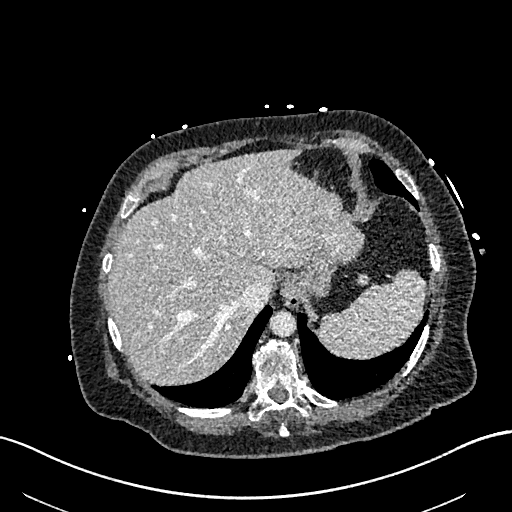
[im 573/655  soft-tissue]
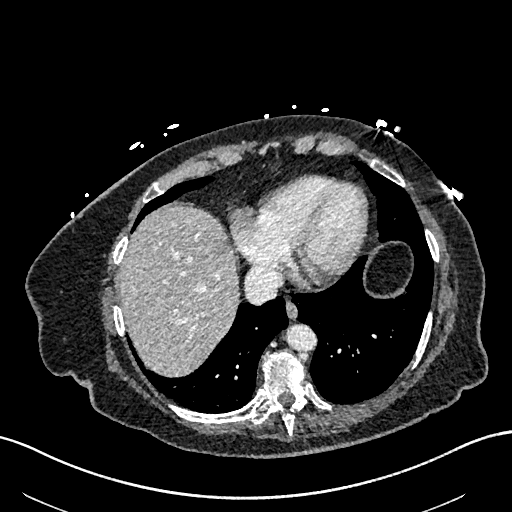

[Series 14: coronals · coronal · 0.79mm/px · 2 of 159 slices shown, 3 images]
[im 53/159  soft-tissue]
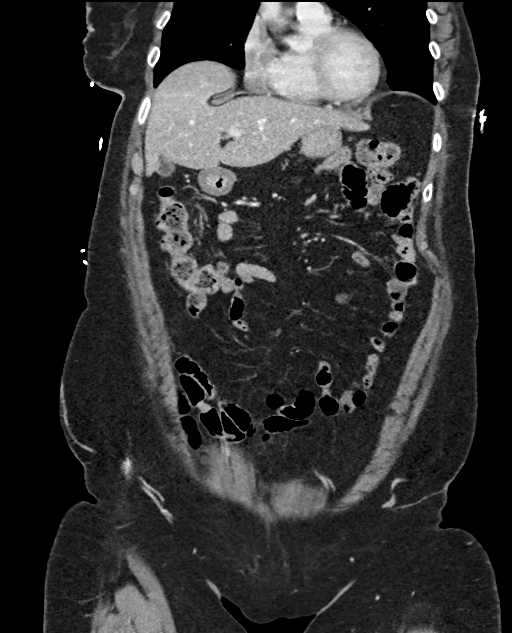
[im 53/159  bone]
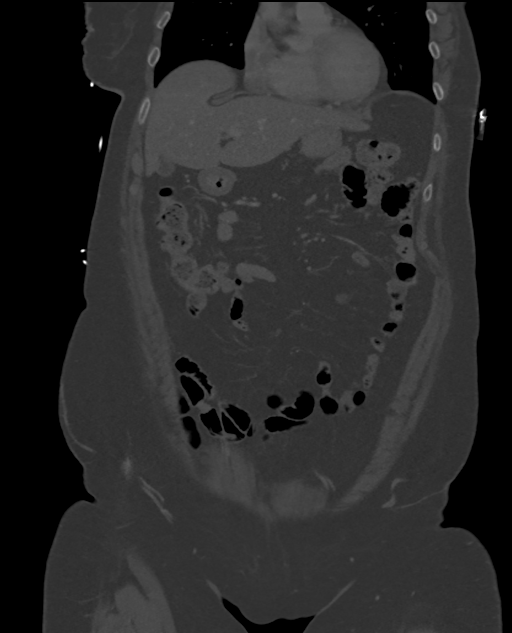
[im 106/159  soft-tissue]
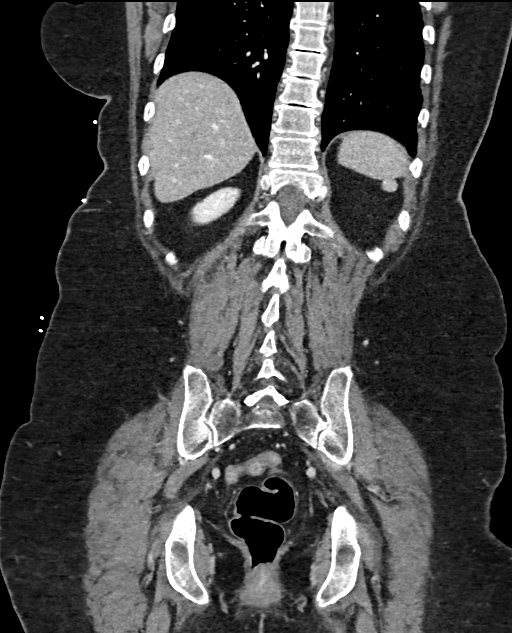

[10 of 46 positions shown; findings below may reference images not displayed]

FINDINGS: VASCULAR

Aorta: Atherosclerotic plaque within the normal caliber aorta. No
dissection, features of vasculitis or significant stenosis.

Celiac: Patent without evidence of aneurysm, dissection, vasculitis
or significant stenosis.

SMA: Patent without evidence of aneurysm, dissection, vasculitis or
significant stenosis.

Renals: Minimal plaque at the origin of left renal artery without
stenosis. Both renal arteries appear patent without aneurysm,
dissection, vasculitis, features of fibromuscular dysplasia or
significant stenosis.

IMA: Atheromatous plaque at the ostium results in at least moderate
narrowing. Vessel opacifies normally. Proximal branches opacified.

Inflow: Atheromatous plaque within common internal and external
without flow-limiting stenosis, aneurysm or dissection.

Proximal Outflow: Included portions the common femoral, superficial
femoral femoral profundus are widely patent without acute features.

Veins: No obvious venous abnormality within the limitations of this
arterial phase study.

Review of the MIP images confirms the above findings.

NON-VASCULAR

Lower chest: Bandlike areas of atelectasis and/or scarring. More
dependent atelectasis posteriorly. Atherosclerotic calcification of
the coronary arteries. Normal heart size. No pericardial effusion.

Hepatobiliary: No focal liver abnormality is seen. Calcified
gallstone near the gallbladder neck. No gallbladder wall thickening,
or biliary dilatation.

Pancreas: Unremarkable. No pancreatic ductal dilatation or
surrounding inflammatory changes.

Spleen: Normal in size without focal abnormality. Accessory
splenules noted within the splenic hilum and posterior to the
spleen.

Adrenals/Urinary Tract: 1.3 cm left adrenal nodule characterizes
adrenal adenoma on prior MRI of the abdomen. The left adrenal is
unremarkable.

Bilateral extrarenal pelves. Kidneys are otherwise, without renal
calculi, suspicious lesion, or hydronephrosis. Circumferential
bladder wall thickening with mild perivesicular haze.

Stomach/Bowel: Distal esophagus, stomach and duodenal sweep are
unremarkable. No bowel wall thickening or dilatation. No evidence of
obstruction. Appendix not clearly identified. No pericecal
inflammation. Scattered colonic diverticula without focal
pericolonic inflammation to suggest diverticulitis.

Lymphatic: No suspicious or enlarged lymph nodes in the included
lymphatic chains.

Reproductive: Uterus is surgically absent. No concerning adnexal
lesions.

Other: No abdominopelvic free fluid or free gas. No bowel containing
hernias. Small fat containing umbilical hernia.

Musculoskeletal: Multilevel degenerative changes are present in the
imaged portions of the spine. No acute osseous abnormality or
suspicious osseous lesion.
IMPRESSION: VASCULAR

1. No evidence of mesenteric arterial occlusive disease.
2.  Aortic Atherosclerosis ([id number]).

NONVASCULAR

1. Circumferential bladder wall thickening with mild perivesicular
haze, suggestive of cystitis. Correlate with urinalysis.
2. Cholelithiasis.
3. Stable 1.3 cm left adrenal adenoma.

## 2020-10-29 ENCOUNTER — Other Ambulatory Visit: Payer: Self-pay

## 2020-10-29 ENCOUNTER — Ambulatory Visit (INDEPENDENT_AMBULATORY_CARE_PROVIDER_SITE_OTHER): Payer: Medicare Other | Admitting: Clinical

## 2020-10-29 DIAGNOSIS — F419 Anxiety disorder, unspecified: Secondary | ICD-10-CM

## 2020-10-29 DIAGNOSIS — F331 Major depressive disorder, recurrent, moderate: Secondary | ICD-10-CM

## 2020-10-29 DIAGNOSIS — F330 Major depressive disorder, recurrent, mild: Secondary | ICD-10-CM

## 2020-10-29 NOTE — Progress Notes (Signed)
Virtual Visit via Telephone Note  I connected withMargaret P. Vincent 1/26/22at 9:00 AM ESTby telephoneand verified that I am speaking with the correct person using two identifiers.  Location: Patient:Home Provider:Office  I discussed the limitations, risks, security and privacy concerns of performing an evaluation and management service by telephone and the availability of in person appointments. I also discussed with the patient that there may be a patient responsible charge related to this service. The patient expressed understanding and agreed to proceed.     THERAPIST PROGRESS NOTE  Session Time:9:00AM-9:55AM  Participation Level:Active  Behavioral Response:CasualAlertDepressed  Type of Therapy:Individual Therapy  Treatment Goals addressed:Coping  Interventions:CBT, DBT, Motivational Interviewing, Solution Focused, Strength-based and Supportive  Summary:Deborah P. Huffinesis a 74y.o.femalewho presents with Depressionand Anxiety.The OPT therapist worked with thepatientfor herongoing OPT treatment. The OPT therapist utilized Motivational Interviewing to assist in creating therapeutic repore. The patient in the session was engaged and work in Science writer about hertriggers and symptoms over the past few weeks includingongoing dealing with complications of Parkinson's and family interactions.The OPT therapist utilized Cognitive Behavioral Therapy through cognitive restructuring as well as worked with the patient on coping strategies to assistmoving forwardincluding reinvesting her time to  beinging active with her health and hobbies.OPT therapist encouraged involvement and for the patient to try not to use her support systemand remain active within her physical limits.The OPT therapist worked with the patient on time management and staging with structure completion of task to reduce triggering and frustration. The OPT  therapist for holistic care inquired with the patient about her medication therapythe patientis still looking for a fit with her psychiatry.  Suicidal/Homicidal:Nowithout intent/plan  Therapist Response:The OPT therapist worked with the patient for the patients scheduled session. The patient was engaged in hersession and gave feedback in relation to triggers, symptoms, and behavior responses over the past fewweeks. The OPT therapist worked with the patient utilizing an in session Cognitive Behavioral Therapy exercise. The patient was responsive in the session andtalked about herintent to take time to continue working on steps to move forward with her physical health and stamina.The OPT therapist worked with the patient onusing protective factors and coping strategies as she continues PT and working to be as independent as possible with in home tasks.The OPT therapist will continue treatment work with the patient in hernext scheduled session.  Plan: Return again in2/3weeks.  Diagnosis:Axis I: Recurrent episode, moderate,depressive disorder with anxiety  Axis II:No diagnosis  I discussed the assessment and treatment plan with the patient. The patient was provided an opportunity to ask questions and all were answered. The patient agreed with the plan and demonstrated an understanding of the instructions.  The patient was advised to call back or seek an in-person evaluation if the symptoms worsen or if the condition fails to improve as anticipated.  I provided70minutes of non-face-to-face time during this encounter.  [name], LCSW  10/29/2020

## 2020-11-10 IMAGING — NM NUCLEAR MEDICINE HEPATOBILIARY IMAGING WITH GALLBLADDER EF
2 series · 12 of 12 positions shown · non-contrast
Comparison: None

CLINICAL DATA: RIGHT upper quadrant pain with nausea and vomiting
for 8 months

EXAM:
NUCLEAR MEDICINE HEPATOBILIARY IMAGING WITH GALLBLADDER EF
TECHNIQUE: Sequential images of the abdomen were obtained [DATE] minutes
following intravenous administration of radiopharmaceutical. After
oral ingestion of Ensure, gallbladder ejection fraction was
determined. At 60 min, normal ejection fraction is greater than 33%.
RADIOPHARMACEUTICALS:  5.2 mCi Kc-RRm  Choletec IV

[Series 1: raw data · 4.46mm/px · 6 of 60 frames shown (1 of 2)]
[frame 6/60]
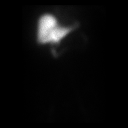
[frame 16/60]
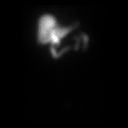
[frame 26/60]
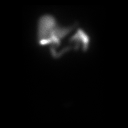
[frame 36/60]
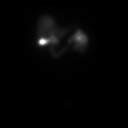
[frame 46/60]
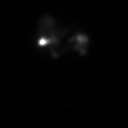
[frame 56/60]
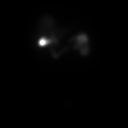

[Series 1: raw data · 4.46mm/px · 6 of 60 frames shown (2 of 2)]
[frame 6/60]
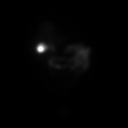
[frame 16/60]
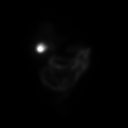
[frame 26/60]
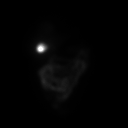
[frame 36/60]
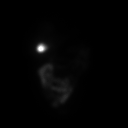
[frame 46/60]
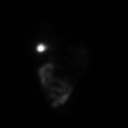
[frame 56/60]
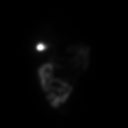

[12 of 12 positions shown; findings below may reference images not displayed]

FINDINGS: Normal tracer extraction from bloodstream indicating normal
hepatocellular function.

Normal excretion of tracer into biliary tree.

Gallbladder visualized at 24 min.

Small bowel visualized at 5 min.

No hepatic retention of tracer.

Subjectively normal emptying of tracer from gallbladder following
fatty meal stimulation.

Calculated gallbladder ejection fraction is 36%, low normal.

Patient reported abdominal pain following Ensure ingestion.

Normal gallbladder ejection fraction following Ensure ingestion is
greater than 33% at 1 hour.
IMPRESSION: Patent biliary tree.

Low normal gallbladder ejection fraction of 36% following Ensure.

Patient reported abdominal pain following Ensure.

## 2020-11-24 ENCOUNTER — Other Ambulatory Visit: Payer: Self-pay

## 2020-11-24 ENCOUNTER — Ambulatory Visit (INDEPENDENT_AMBULATORY_CARE_PROVIDER_SITE_OTHER): Payer: Medicare Other | Admitting: Clinical

## 2020-11-24 DIAGNOSIS — F419 Anxiety disorder, unspecified: Secondary | ICD-10-CM

## 2020-11-24 DIAGNOSIS — F331 Major depressive disorder, recurrent, moderate: Secondary | ICD-10-CM | POA: Diagnosis not present

## 2020-11-24 NOTE — Progress Notes (Signed)
  Virtual Visit via Telephone Note  I connected withMargaret P. Huffines2/21/22at1:00PM ESTby telephoneand verified that I am speaking with the correct person using two identifiers.  Location: Patient:Home Provider:Office  I discussed the limitations, risks, security and privacy concerns of performing an evaluation and management service by telephone and the availability of in person appointments. I also discussed with the patient that there may be a patient responsible charge related to this service. The patient expressed understanding and agreed to proceed.     THERAPIST PROGRESS NOTE  Session Time:1:00PM-1:45PM  Participation Level:Active  Behavioral Response:CasualAlertDepressed  Type of Therapy:Individual Therapy  Treatment Goals addressed:Coping  Interventions:CBT, DBT, Motivational Interviewing, Solution Focused, Strength-based and Supportive  Summary:Deborah P. Huffinesis a74y.o.femalewho presents with Depressionand Anxiety.The OPT therapist worked with thepatientfor herongoing OPT treatment. The OPT therapist utilized Motivational Interviewing to assist in creating therapeutic repore. The patient in the session was engaged and work in Science writer about hertriggers and symptoms over the past few weeks includingongoing dealing withcomplications of Parkinson's including physical limitations and family interactions.The OPT therapist utilized Cognitive Behavioral Therapy through cognitive restructuring as well as worked with the patient on coping strategies to assistmoving forwardincluding reinvesting her time to beingingactive with her health and hobbies while pacing and utilizing her supports as needed to complete task when she is unable.The OPT therapist worked with the patient on time management and staging with structure completion of task to reduce triggering and frustration. The OPT therapist worked with the  patient on positive thinking. .  Suicidal/Homicidal:Nowithout intent/plan  Therapist Response:The OPT therapist worked with the patient for the patients scheduled session. The patient was engaged in hersession and gave feedback in relation to triggers, symptoms, and behavior responses over the past fewweeks. The OPT therapist worked with the patient utilizing an in session Cognitive Behavioral Therapy exercise. The patient was responsive in the session and noted, " OI have been working to pace myself better and that has been working well.The OPT therapist worked with the patient onusing protective factors and coping strategies as she continues  working to be as independent as possible with in home tasks.The OPT therapist will continue treatment work with the patient in hernext scheduled session.  Plan: Return again in2/3weeks.  Diagnosis:Axis I: Recurrent episode, moderate,depressive disorder with anxiety  Axis II:No diagnosis  I discussed the assessment and treatment plan with the patient. The patient was provided an opportunity to ask questions and all were answered. The patient agreed with the plan and demonstrated an understanding of the instructions.  The patient was advised to call back or seek an in-person evaluation if the symptoms worsen or if the condition fails to improve as anticipated.  I provided38minutes of non-face-to-face time during this encounter.  [name], LCSW  11/24/2020

## 2020-12-18 ENCOUNTER — Ambulatory Visit (INDEPENDENT_AMBULATORY_CARE_PROVIDER_SITE_OTHER): Payer: Medicare Other | Admitting: Clinical

## 2020-12-18 ENCOUNTER — Other Ambulatory Visit: Payer: Self-pay

## 2020-12-18 DIAGNOSIS — F419 Anxiety disorder, unspecified: Secondary | ICD-10-CM

## 2020-12-18 DIAGNOSIS — F331 Major depressive disorder, recurrent, moderate: Secondary | ICD-10-CM

## 2020-12-18 NOTE — Progress Notes (Signed)
Virtual Visit via Telephone Note  I connected withMargaret P. Huffines3/17/22at1:00PM ESTby telephoneand verified that I am speaking with the correct person using two identifiers.  Location: Patient:Home Provider:Office  I discussed the limitations, risks, security and privacy concerns of performing an evaluation and management service by telephone and the availability of in person appointments. I also discussed with the patient that there may be a patient responsible charge related to this service. The patient expressed understanding and agreed to proceed.     THERAPIST PROGRESS NOTE  Session Time:1:00PM-1:30PM  Participation Level:Active  Behavioral Response:CasualAlertDepressed  Type of Therapy:Individual Therapy  Treatment Goals addressed:Coping  Interventions:CBT, DBT, Motivational Interviewing, Solution Focused, Strength-based and Supportive  Summary:Deborah P. Huffinesis a74y.o.femalewho presents with Depressionand Anxiety.The OPT therapist worked with thepatientfor herongoingOPT treatment. The OPT therapist utilized Motivational Interviewing to assist in creating therapeutic repore. The patient in the session was engaged and work in Science writer about hertriggers and symptoms over the past few weeks includingongoing dealing withcomplications ofParkinson's including physical limitations and family interactions. The patient noted she has been working on the details preparing for a upcoming trip in April to go to [place] to visit family.The OPT therapist utilized Cognitive Behavioral Therapy through cognitive restructuring as well as worked with the patient on coping strategies to assistmoving forwardincluding reinvesting her time to beingingactive with her health and hobbies while pacing and utilizing her supports as needed to complete task when she is unable.The OPT therapist worked with the patient on time  management and staging with structure completion of task to reduce triggering and frustration.The OPT therapist worked with the patient on positive thinking. .  Suicidal/Homicidal:Nowithout intent/plan  Therapist Response:The OPT therapist worked with the patient for the patients scheduled session. The patient was engaged in hersession and gave feedback in relation to triggers, symptoms, and behavior responses over the past fewweeks. The OPT therapist worked with the patient utilizing an in session Cognitive Behavioral Therapy exercise. The patient was responsive in the session and noted, " I get fixated on completing a task and sometimes I dont want to stop but I need to and take breaks because when I over do it I have trouble recovering".The OPT therapist worked with the patient onusing protective factors and coping strategies as shecontinues  working to be as independent as possible with in home tasks.The OPT therapist will continue treatment work with the patient in hernext scheduled session.  Plan: Return again in2/3weeks.  Diagnosis:Axis I: Recurrent episode, moderate,depressive disorder with anxiety  Axis II:No diagnosis  I discussed the assessment and treatment plan with the patient. The patient was provided an opportunity to ask questions and all were answered. The patient agreed with the plan and demonstrated an understanding of the instructions.  The patient was advised to call back or seek an in-person evaluation if the symptoms worsen or if the condition fails to improve as anticipated.  I provided56minutes of non-face-to-face time during this encounter.  [name], LCSW  12/18/2020

## 2020-12-23 NOTE — Progress Notes (Signed)
Assessment/Plan:   1.  Parkinsons Disease  -Continue carbidopa/levodopa 25/100, 1 tablet at 6am/9am/noon/3pm/6pm/9pm.  Deborah Vincent understands not to change dosage in future without letting me know/approve  -Continue carbidopa/levodopa 50/200 but she is taking that at midnight -  Thinks less cramping that way  -Continue rasagiline, 1 mg daily  -continue entacapone, 200 mg, 1 tablet with first 3 dosages of carbidopa/levodopa 25/100  -Currently in physical therapy and is really feeling well and enjoying it.  Told her she needs to have some goals for when physical therapy is completed and how she will transition to a community exercise program.  2.  Gait instability  -Has been long-term and multifactorial.  Some due to Parkinson's disease, some due to lack of exercise, some due to medications (been on long-term Xanax 3 times per day).  Her daughter did ask about weaning Xanax today and we talked about it in detail.  I think it would be wise.  She reports that she is taking Xanax to help with the cramping from constipation in her stomach, and I told her I did not think that that was a good use of Xanax.  3.  Depression  -Has been a great influence her in physical and mental health, and right now she is doing really well in this regard.    -Patient is now in counseling.  Proud of her for this and expressed that.   4.  Chronic nausea and constipation  -We have been able to prove that the nausea is not from Parkinson's or its medications.  Medications have been completely reworked without success.  -back on linzess.  Told her to increase MiraLAX to twice per day.  Told her to increase her water intake.  She may need to go back to GI for the constipation.  5.  Urinary retention  -Follows with urology.  On Flomax  6.  Low BP  -on lisinopril.  She is watching her BP but only taking prn.  Discussed concept of permissive HTN.  7.  Constipation  -discussed nature and pathophysiology and association with  PD  -discussed importance of hydration.  Deborah Vincent is to increase water intake  -Deborah Vincent is given a copy of the rancho recipe  -recommended daily colace  -recommended miralax bid    Subjective:   Deborah Vincent was seen today in follow up for Parkinsons disease.  My previous records were reviewed prior to todays visit as well as outside records available to me.  Deborah Vincent with daughter who supplements hx. medical records reviewed since last visit.  Patient has been in counseling for depression.  Deborah Vincent c/o trouble writing.  Has 2 falls in last 6 months, which she states is good.  One in bathroom and she turned and lost balance.  One was last night.  She didn't get hurt.    Current prescribed movement disorder medications: carbidopa/levodopa 25/100, 1 tablet at 6am/9am/noon/3pm/6pm/9pm Carbidopa/levodopa 50/200 at midnight Entacapone 200 mg, 1 tablet with first 3 dosages of levodopa Rasagiline, 1 mg daily  ALLERGIES:   Allergies  Allergen Reactions   Nubain [Nalbuphine Hcl] Anaphylaxis   Ciprofloxacin Hives   Macrobid [Nitrofurantoin Macrocrystal] Nausea Only   Naproxen Hives   Sulfa Antibiotics Hives and Itching   Vicodin [Hydrocodone-Acetaminophen] Other (See Comments)    Overuse in past-prefers not to take   Other Other (See Comments) and Rash    PULLS SKIN OFF ; PAPER TAPE OK TO USE    CURRENT MEDICATIONS:  Outpatient Encounter Medications as of  12/25/2020  Medication Sig   acetaminophen (TYLENOL) 325 MG tablet Take 650 mg by mouth daily as needed for mild pain or moderate pain.    ALPRAZolam (XANAX) 0.5 MG tablet Take 1 tablet (0.5 mg total) by mouth 3 (three) times daily as needed for anxiety.   anastrozole (ARIMIDEX) 1 MG tablet Take 1 mg by mouth at bedtime.    aspirin EC 325 MG tablet Take 325 mg by mouth in the morning and at bedtime.   buPROPion (WELLBUTRIN) 100 MG tablet Take 100 mg by mouth 2 (two) times daily.   carbidopa-levodopa (SINEMET CR) 50-200 MG tablet TAKE 1  TABLET BY MOUTH EVERYDAY AT BEDTIME   carbidopa-levodopa (SINEMET IR) 25-100 MG tablet Take 1 tablet by mouth 6 (six) times daily. 6am/9am/12pm/3pm/6pm/9pm   diclofenac sodium (VOLTAREN) 1 % GEL Apply 1 application topically 4 (four) times daily as needed (knee pain).    Docusate Calcium (STOOL SOFTENER PO) Take 1 tablet by mouth daily.   entacapone (COMTAN) 200 MG tablet TAKE 1 TABLET (200 MG TOTAL) BY MOUTH 3 (THREE) TIMES DAILY.   Lidocaine 4 % PTCH Apply 1 patch topically 2 (two) times daily as needed (pain).   linaclotide (LINZESS) 145 MCG CAPS capsule Take 145 mcg by mouth daily before breakfast.   lisinopril (ZESTRIL) 5 MG tablet Take 2.5 mg by mouth in the morning and at bedtime.    nystatin cream (MYCOSTATIN) Apply 1 application topically 2 (two) times daily as needed for dry skin.   pantoprazole (PROTONIX) 20 MG tablet Take 20 mg by mouth daily.   polyethylene glycol (MIRALAX / GLYCOLAX) 17 g packet Take 17 g by mouth daily.   RASAGILINE MESYLATE PO Take 1 mg by mouth daily.   tamsulosin (FLOMAX) 0.4 MG CAPS capsule Take 0.4 mg by mouth daily as needed.   traMADol (ULTRAM) 50 MG tablet Take 1 tablet (50 mg total) by mouth every 6 (six) hours as needed.   phenylephrine-shark liver oil-mineral oil-petrolatum (PREPARATION H) 0.25-3-14-71.9 % rectal ointment Place 1 application rectally 2 (two) times daily as needed for hemorrhoids. (Patient not taking: Reported on 12/25/2020)   [DISCONTINUED] carbidopa-levodopa (SINEMET IR) 25-100 MG tablet Take 1 tablet by mouth 5 (five) times daily. 7 AM/10 AM/1 PM/4 PM/7 PM (Patient taking differently: No sig reported)   No facility-administered encounter medications on file as of 12/25/2020.    Objective:   PHYSICAL EXAMINATION:    VITALS:   Vitals:   12/25/20 1424  BP: (!) 98/58  Pulse: 96  SpO2: 98%  Weight: 179 lb (81.2 kg)  Height: 5\' 2"  (1.575 m)    GEN:  The patient appears stated age and is in NAD.  She is jovial and  laughing. HEENT:  Normocephalic, atraumatic.  The mucous membranes are moist. The superficial temporal arteries are without ropiness or tenderness. CV:  RRR Lungs:  CTAB Neck/HEME:  There are no carotid bruits bilaterally.  Neurological examination:  Orientation: The patient is alert and oriented x3. Cranial nerves: There is good facial symmetry with facial hypomimia. The speech is fluent and clear. Soft palate rises symmetrically and there is no tongue deviation. Hearing is intact to conversational tone. Sensation: Sensation is intact to light touch throughout Motor: Strength is at least antigravity x4.  Movement examination: Tone: There is nl tone in the ue/le Abnormal movements: she has some dyskinesia of both feet Coordination:  There is no decremation with RAM's Gait and Station: Patient able to arise OOC.  She walks really well even  without the walker.  She turns well.    I have reviewed and interpreted the following labs independently    Chemistry      Component Value Date/Time   NA 135 04/01/2020 2151   K 4.0 04/01/2020 2151   CL 97 (L) 04/01/2020 2151   CO2 27 04/01/2020 2151   BUN 15 04/01/2020 2151   CREATININE 0.75 04/01/2020 2151      Component Value Date/Time   CALCIUM 8.6 (L) 04/01/2020 2151   ALKPHOS 78 04/01/2020 2151   AST 11 (L) 04/01/2020 2151   ALT 6 04/01/2020 2151   BILITOT 0.7 04/01/2020 2151       Lab Results  Component Value Date   WBC 9.0 04/01/2020   HGB 11.6 (L) 04/01/2020   HCT 34.6 (L) 04/01/2020   MCV 94.0 04/01/2020   PLT 274 04/01/2020    No results found for: TSH   Total time spent on today's visit was 30 minutes, including both face-to-face time and nonface-to-face time.  Time included that spent on review of records (prior notes available to me/labs/imaging if pertinent), discussing treatment and goals, answering patient's questions and coordinating care.  Cc:  [name], MD

## 2020-12-25 ENCOUNTER — Encounter: Payer: Self-pay | Admitting: Neurology

## 2020-12-25 ENCOUNTER — Other Ambulatory Visit: Payer: Self-pay

## 2020-12-25 ENCOUNTER — Ambulatory Visit: Payer: Medicare Other | Admitting: Neurology

## 2020-12-25 VITALS — BP 98/58 | HR 96 | Ht 62.0 in | Wt 179.0 lb

## 2020-12-25 DIAGNOSIS — K5901 Slow transit constipation: Secondary | ICD-10-CM | POA: Diagnosis not present

## 2020-12-25 DIAGNOSIS — G20 Parkinson's disease: Secondary | ICD-10-CM

## 2020-12-25 MED ORDER — CARBIDOPA-LEVODOPA ER 50-200 MG PO TBCR: 1.0000 | EXTENDED_RELEASE_TABLET | Freq: Every day | ORAL | 1 refills | Status: DC

## 2020-12-25 NOTE — Patient Instructions (Signed)
Constipation and Parkinson's disease:  1.Rancho recipe for constipation in Parkinsons Disease:  -1 cup of unprocessed bran (need to get this at [employer], [employer] or similar type of store), 2 cups of applesauce in 1 cup of prune juice 2.  Increase fiber intake (Metamucil,vegetables) 3.  Regular, moderate exercise can be beneficial. 4.  Avoid medications causing constipation, such as medications like antacids with calcium or magnesium 5.  It's okay to take daily Miralax twice per day! 6.  Stool softeners (Colace) can help with chronic constipation and I recommend you take this daily. 7.  Increase water intake.  You should be drinking 1/2 gallon of water a day as long as you have not been diagnosed with congestive heart failure or renal/kidney failure.  This is probably the single greatest thing that you can do to help your constipation.

## 2021-01-09 ENCOUNTER — Other Ambulatory Visit: Payer: Self-pay | Admitting: Neurology

## 2021-01-12 NOTE — Telephone Encounter (Signed)
Rx(s) sent to pharmacy electronically.  

## 2021-01-15 ENCOUNTER — Encounter (HOSPITAL_COMMUNITY): Payer: Self-pay

## 2021-01-15 ENCOUNTER — Ambulatory Visit (INDEPENDENT_AMBULATORY_CARE_PROVIDER_SITE_OTHER): Payer: Medicare Other | Admitting: Clinical

## 2021-01-15 ENCOUNTER — Other Ambulatory Visit: Payer: Self-pay

## 2021-01-15 DIAGNOSIS — F331 Major depressive disorder, recurrent, moderate: Secondary | ICD-10-CM

## 2021-01-15 DIAGNOSIS — F419 Anxiety disorder, unspecified: Secondary | ICD-10-CM

## 2021-01-15 NOTE — Progress Notes (Signed)
Virtual Visit via Telephone Note  I connected withMargaret P. Huffines4/14/22at1:00PM ESTby telephoneand verified that I am speaking with the correct person using two identifiers.  Location: Patient:Home Provider:Office  I discussed the limitations, risks, security and privacy concerns of performing an evaluation and management service by telephone and the availability of in person appointments. I also discussed with the patient that there may be a patient responsible charge related to this service. The patient expressed understanding and agreed to proceed.     THERAPIST PROGRESS NOTE  Session Time:1:00PM-1:45PM  Participation Level:Active  Behavioral Response:CasualAlertDepressed  Type of Therapy:Individual Therapy  Treatment Goals addressed:Coping  Interventions:CBT, DBT, Motivational Interviewing, Solution Focused, Strength-based and Supportive  Summary:Deborah P. Huffinesis a74y.o.femalewho presents with Depressionand Anxiety.The OPT therapist worked with thepatientfor herongoingOPT treatment. The OPT therapist utilized Motivational Interviewing to assist in creating therapeutic repore. The patient in the session was engaged and work in Science writer about hertriggers and symptoms over the past few weeks includingongoing dealing withcomplications ofParkinson'sincluding physical limitationsand planning for a upcoming trip to visit family The patient noted she has been working on the details preparing for a upcoming trip just 5 days from now to [place] to visit family.The OPT therapist utilized Cognitive Behavioral Therapy through cognitive restructuring as well as worked with the patient on coping strategies to assistmoving forwardincluding reinvesting her time to beingingactive with her health and hobbieswhile pacing and utilizing her supports as needed to complete task when she is unable.The OPT therapist worked  with the patient on time management and staging with structure completion of task to reduce triggering and frustration.The OPT therapist worked with the patient on positive thinking..  Suicidal/Homicidal:Nowithout intent/plan  Therapist Response:The OPT therapist worked with the patient for the patients scheduled session. The patient was engaged in hersession and gave feedback in relation to triggers, symptoms, and behavior responses over the past fewweeks. The OPT therapist worked with the patient utilizing an in session Cognitive Behavioral Therapy exercise. The patient was responsive in the session andnoted, " I am coming to terms better that I may have a different opinion from someone and that I cant let that impact me or stay with me"..The OPT therapist worked with the patient onusing protective factors and coping strategies as shecontinues working to be as independent as possible with in home tasks.The OPT therapist will continue treatment work with the patient in hernext scheduled session.  Plan: Return again in2/3weeks.  Diagnosis:Axis I: Recurrent episode, moderate,depressive disorder with anxiety  Axis II:No diagnosis  I discussed the assessment and treatment plan with the patient. The patient was provided an opportunity to ask questions and all were answered. The patient agreed with the plan and demonstrated an understanding of the instructions.  The patient was advised to call back or seek an in-person evaluation if the symptoms worsen or if the condition fails to improve as anticipated.  I provided10minutes of non-face-to-face time during this encounter.  [name], LCSW  01/15/2021

## 2021-03-16 ENCOUNTER — Other Ambulatory Visit: Payer: Self-pay

## 2021-03-16 ENCOUNTER — Ambulatory Visit (INDEPENDENT_AMBULATORY_CARE_PROVIDER_SITE_OTHER): Payer: Medicare Other | Admitting: Clinical

## 2021-03-16 DIAGNOSIS — F419 Anxiety disorder, unspecified: Secondary | ICD-10-CM | POA: Diagnosis not present

## 2021-03-16 DIAGNOSIS — F331 Major depressive disorder, recurrent, moderate: Secondary | ICD-10-CM

## 2021-03-16 NOTE — Progress Notes (Signed)
Virtual Visit via Telephone Note   I connected with Deborah Vincent 03/16/21 at 1:00 PM EST by telephone and verified that I am speaking with the correct person using two identifiers.   Location: Patient: Home Provider: Office   I discussed the limitations, risks, security and privacy concerns of performing an evaluation and management service by telephone and the availability of in person appointments. I also discussed with the patient that there may be a patient responsible charge related to this service. The patient expressed understanding and agreed to proceed.         THERAPIST PROGRESS NOTE   Session Time: 1:00PM-1:45PM   Participation Level: Active   Behavioral Response: CasualAlertDepressed   Type of Therapy: Individual Therapy   Treatment Goals addressed: Coping   Interventions: CBT, DBT, Motivational Interviewing, Solution Focused, Strength-based and Supportive   Summary: Deborah Vincent is a 75 y.o. female who presents with Depression and Anxiety.The OPT therapist worked with the patient for her ongoing OPT treatment. The OPT therapist utilized Motivational Interviewing to assist in creating therapeutic repore. The patient in the session was engaged and work in collaboration giving feedback about her triggers and symptoms over the past few weeks including her recent trip to [place] to visit with family and some identified concerns around a caregiver service she is receiving locally.  The OPT therapist utilized Cognitive Behavioral Therapy through cognitive restructuring as well as worked with the patient on coping strategies to assist moving forward including ensuring she is getting effective service from her caregiving agency. The OPT therapist worked with the patient on time management and staging with structure completion of task to reduce triggering and frustration. The OPT therapist worked with the patient on positive thinking. .   Suicidal/Homicidal:  Nowithout intent/plan   Therapist Response: The OPT therapist worked with the patient for the patients scheduled session. The patient was engaged in her session and gave feedback in relation to triggers, symptoms, and behavior responses over the past few weeks. The OPT therapist worked with the patient utilizing an in session Cognitive Behavioral Therapy exercise. The patient was responsive in the session and noted, " I feel more confident now about my abilities with Parkinson's to travel since my trip to [place]".The OPT therapist worked with the patient on using protective factors and coping strategies as she continues  working to be as independent as possible with in home tasks. The OPT therapist will continue treatment work with the patient in her next scheduled session.   Plan: Return again in 2/3 weeks.   Diagnosis:      Axis I:  Recurrent episode, moderate, depressive disorder with anxiety                            Axis II: No diagnosis   I discussed the assessment and treatment plan with the patient. The patient was provided an opportunity to ask questions and all were answered. The patient agreed with the plan and demonstrated an understanding of the instructions.   The patient was advised to call back or seek an in-person evaluation if the symptoms worsen or if the condition fails to improve as anticipated.   I provided 40 minutes of non-face-to-face time during this encounter.   [name], LCSW   03/16/2021

## 2021-03-17 ENCOUNTER — Emergency Department (HOSPITAL_COMMUNITY): Payer: Medicare Other

## 2021-03-17 ENCOUNTER — Other Ambulatory Visit: Payer: Self-pay

## 2021-03-17 ENCOUNTER — Encounter (HOSPITAL_COMMUNITY): Payer: Self-pay

## 2021-03-17 ENCOUNTER — Emergency Department (HOSPITAL_COMMUNITY)
Admission: EM | Admit: 2021-03-17 | Discharge: 2021-03-17 | Disposition: A | Discharge status: Home / Self Care | Payer: Medicare Other | Attending: Emergency Medicine | Admitting: Emergency Medicine

## 2021-03-17 DIAGNOSIS — Z853 Personal history of malignant neoplasm of breast: Secondary | ICD-10-CM | POA: Insufficient documentation

## 2021-03-17 DIAGNOSIS — G20 Parkinson's disease: Secondary | ICD-10-CM | POA: Insufficient documentation

## 2021-03-17 DIAGNOSIS — Z96659 Presence of unspecified artificial knee joint: Secondary | ICD-10-CM | POA: Insufficient documentation

## 2021-03-17 DIAGNOSIS — I10 Essential (primary) hypertension: Secondary | ICD-10-CM | POA: Insufficient documentation

## 2021-03-17 DIAGNOSIS — Z7982 Long term (current) use of aspirin: Secondary | ICD-10-CM | POA: Diagnosis not present

## 2021-03-17 DIAGNOSIS — W01198A Fall on same level from slipping, tripping and stumbling with subsequent striking against other object, initial encounter: Secondary | ICD-10-CM | POA: Insufficient documentation

## 2021-03-17 DIAGNOSIS — S0990XA Unspecified injury of head, initial encounter: Secondary | ICD-10-CM | POA: Diagnosis present

## 2021-03-17 DIAGNOSIS — W19XXXA Unspecified fall, initial encounter: Secondary | ICD-10-CM

## 2021-03-17 DIAGNOSIS — Z79899 Other long term (current) drug therapy: Secondary | ICD-10-CM | POA: Insufficient documentation

## 2021-03-17 LAB — CBC WITH DIFFERENTIAL/PLATELET
Abs Immature Granulocytes: 0.04 10*3/uL (ref 0.00–0.07)
Basophils Absolute: 0 10*3/uL (ref 0.0–0.1)
Basophils Relative: 0 %
Eosinophils Absolute: 0.2 10*3/uL (ref 0.0–0.5)
Eosinophils Relative: 2 %
HCT: 42.7 % (ref 36.0–46.0)
Hemoglobin: 13.6 g/dL (ref 12.0–15.0)
Immature Granulocytes: 0 %
Lymphocytes Relative: 21 %
Lymphs Abs: 2.2 10*3/uL (ref 0.7–4.0)
MCH: 29.9 pg (ref 26.0–34.0)
MCHC: 31.9 g/dL (ref 30.0–36.0)
MCV: 93.8 fL (ref 80.0–100.0)
Monocytes Absolute: 0.6 10*3/uL (ref 0.1–1.0)
Monocytes Relative: 6 %
Neutro Abs: 7.4 10*3/uL (ref 1.7–7.7)
Neutrophils Relative %: 71 %
Platelets: 234 10*3/uL (ref 150–400)
RBC: 4.55 MIL/uL (ref 3.87–5.11)
RDW: 13.7 % (ref 11.5–15.5)
WBC: 10.5 10*3/uL (ref 4.0–10.5)
nRBC: 0 % (ref 0.0–0.2)

## 2021-03-17 LAB — BASIC METABOLIC PANEL
Anion gap: 8 (ref 5–15)
BUN: 15 mg/dL (ref 8–23)
CO2: 25 mmol/L (ref 22–32)
Calcium: 8.7 mg/dL — ABNORMAL LOW (ref 8.9–10.3)
Chloride: 104 mmol/L (ref 98–111)
Creatinine, Ser: 0.92 mg/dL (ref 0.44–1.00)
GFR, Estimated: >60 mL/min (ref >60)
Glucose, Bld: 116 mg/dL — ABNORMAL HIGH (ref 70–99)
Potassium: 4.2 mmol/L (ref 3.5–5.1)
Sodium: 137 mmol/L (ref 135–145)

## 2021-03-17 MED ORDER — ACETAMINOPHEN 325 MG PO TABS
650.0000 mg | ORAL_TABLET | Freq: Once | ORAL | Status: AC
  Administered 2021-03-17: 650 mg via ORAL
  Filled 2021-03-17: qty 2

## 2021-03-17 MED ORDER — KETOROLAC TROMETHAMINE 30 MG/ML IJ SOLN
30.0000 mg | Freq: Once | INTRAMUSCULAR | Status: AC
  Administered 2021-03-17: 30 mg via INTRAMUSCULAR
  Filled 2021-03-17: qty 1

## 2021-03-17 NOTE — ED Triage Notes (Signed)
Pt has a history of parkinson's and fell trying to get to her walker and fell.  Pt hit her head, no blood thinners reported, no LOC, pt just wanted to be checked out. Small area on back of her head no bleeding

## 2021-03-17 NOTE — Discharge Instructions (Addendum)
It was a pleasure taking care of you today. As discussed, your CT head and neck were negative for any bleeding or broken bones. All of your labs were reassuring today. You may take over the counter ibuprofen or tylenol as needed for pain. Follow-up with PCP within the next week for further evaluation. Return to the ER for new or worsening symptoms.

## 2021-03-17 NOTE — ED Notes (Signed)
Pt ambulate a few steps with assistance. To w/c.

## 2021-03-17 NOTE — ED Provider Notes (Signed)
[facility] EMERGENCY DEPARTMENT Provider Note   CSN: [id number] Arrival date & time: 03/17/21  1255     History Chief Complaint  Patient presents with   Fall    NO LOC/ NO blood thinners    Deborah Vincent is a 75 y.o. female with a past medical history significant for hyperlipidemia, hypertension, Parkinson's disease, and history of breast cancer who presents to the ED after mechanical fall.  Patient states she was reaching for her walker and fell backwards hitting her head on concrete.  No loss of consciousness.  She is not currently on any blood thinners.  Patient admits to a frontal headache.  Patient denies visual changes, speech changes, unilateral weakness.  She admits to dizziness at baseline with head movements which is not changed since her fall.  She applied ice to the back of her head with moderate relief.  Denies nausea and vomiting.  Denies low back pain. No treatment prior to arrival.   History obtained from patient and past medical records. No interpreter used during encounter.       Past Medical History:  Diagnosis Date   Breast cancer ([place])    Breast Cancer   Depression    Hyperlipidemia    Hypertension    Neuropathy    Osteoarthritis    Parkinson's disease ([place])    Urinary tract infection     Patient Active Problem List   Diagnosis Date Noted   Chronic cholecystitis with calculus 06/13/2019   MDD (major depressive disorder), recurrent episode, mild ([place]) 02/05/2019   Parkinson's disease ([place]) 04/18/2018   Low back pain 04/18/2018   Hypertensive disorder 04/18/2018   Depressive disorder 04/18/2018   Osteoarthritis of left knee 04/18/2018    Past Surgical History:  Procedure Laterality Date   ABDOMINAL EXPLORATION SURGERY  1999   intestinal adhesions   ABDOMINAL HYSTERECTOMY  1982   BREAST LUMPECTOMY Left 04/03/2012   CHOLECYSTECTOMY N/A 06/13/2019   Procedure: LAPAROSCOPIC CHOLECYSTECTOMY;  Surgeon: Donnie Mesa, MD;  Location: [place];   Service: General;  Laterality: N/A;   INTRAOPERATIVE CHOLANGIOGRAM N/A 06/13/2019   Procedure: Attempted Intraoperative Cholangiogram;  Surgeon: Donnie Mesa, MD;  Location: [place];  Service: General;  Laterality: N/A;  Attempted unable to perform   LAPAROSCOPIC LYSIS OF ADHESIONS N/A 06/13/2019   Procedure: Laparoscopic Lysis Of Adhesions;  Surgeon: Donnie Mesa, MD;  Location: [place];  Service: General;  Laterality: N/A;   left mastectomy     MASTECTOMY Left 04/28/2012   RENAL ANGIOPLASTY Left 1992   REVISION TOTAL KNEE ARTHROPLASTY       OB History   No obstetric history on file.     Family History  Problem Relation Age of Onset   Cancer Mother        GI    Hypertension Mother    Depression Mother    Alcohol abuse Mother    Lung cancer Father    Alcohol abuse Father    Other Sister        Wagner's granuloma    Social History   Tobacco Use   Smoking status: Never   Smokeless tobacco: Never  Vaping Use   Vaping Use: Never used  Substance Use Topics   Alcohol use: No    Comment: prior alcohol dependence   Drug use: Not Currently    Types: Marijuana    Comment: in the 1960's    Home Medications Prior to Admission medications   Medication Sig Start Date End Date Taking? Authorizing  Provider  acetaminophen (TYLENOL) 325 MG tablet Take 650 mg by mouth daily as needed for mild pain or moderate pain.    Yes [provider]  ALPRAZolam (XANAX) 0.5 MG tablet Take 1 tablet (0.5 mg total) by mouth 3 (three) times daily as needed for anxiety. 04/03/20  Yes Sherwood Gambler, MD  anastrozole (ARIMIDEX) 1 MG tablet Take 1 mg by mouth at bedtime.    Yes [provider]  aspirin EC 325 MG tablet Take 325 mg by mouth in the morning and at bedtime.   Yes [provider]  buPROPion (WELLBUTRIN) 100 MG tablet Take 100 mg by mouth 2 (two) times daily.   Yes [provider]  carbidopa-levodopa (SINEMET CR) 50-200 MG tablet Take 1 tablet by mouth at  bedtime. 12/25/20  Yes Tat, Eustace Quail, DO  carbidopa-levodopa (SINEMET IR) 25-100 MG tablet Take 1 tablet by mouth 6 (six) times daily. 6am/9am/12pm/3pm/6pm/9pm   Yes [provider]  diclofenac sodium (VOLTAREN) 1 % GEL Apply 1 application topically 4 (four) times daily as needed (knee pain).    Yes [provider]  Docusate Calcium (STOOL SOFTENER PO) Take 1 tablet by mouth daily.   Yes [provider]  entacapone (COMTAN) 200 MG tablet TAKE 1 TABLET (200 MG TOTAL) BY MOUTH 3 (THREE) TIMES DAILY. 01/12/21  Yes Tat, Eustace Quail, DO  Lidocaine 4 % PTCH Apply 1 patch topically 2 (two) times daily as needed (pain).   Yes [provider]  linaclotide (LINZESS) 145 MCG CAPS capsule Take 145 mcg by mouth daily before breakfast.   Yes [provider]  lisinopril (ZESTRIL) 5 MG tablet Take 2.5 mg by mouth in the morning and at bedtime.    Yes [provider]  nystatin cream (MYCOSTATIN) Apply 1 application topically 2 (two) times daily as needed for dry skin.   Yes [provider]  pantoprazole (PROTONIX) 40 MG tablet Take 1 tablet by mouth daily. 02/03/21  Yes [provider]  polyethylene glycol (MIRALAX / GLYCOLAX) 17 g packet Take 17 g by mouth daily.   Yes [provider]  RASAGILINE MESYLATE PO Take 1 mg by mouth daily.   Yes [provider]  tamsulosin (FLOMAX) 0.4 MG CAPS capsule Take 0.4 mg by mouth daily as needed. 01/10/20  Yes [provider]  traMADol (ULTRAM) 50 MG tablet Take 1 tablet (50 mg total) by mouth every 6 (six) hours as needed. 07/27/20  Yes Isla Pence, MD  phenylephrine-shark liver oil-mineral oil-petrolatum (PREPARATION H) 0.25-3-14-71.9 % rectal ointment Place 1 application rectally 2 (two) times daily as needed for hemorrhoids. Patient not taking: No sig reported    [provider]    Allergies    Nubain [nalbuphine hcl], Ciprofloxacin, Macrobid [nitrofurantoin  macrocrystal], Naproxen, Sulfa antibiotics, Vicodin [hydrocodone-acetaminophen], and Other  Review of Systems   Review of Systems  Eyes:  Negative for visual disturbance.  Gastrointestinal:  Negative for nausea and vomiting.  Neurological:  Positive for dizziness and headaches. Negative for facial asymmetry, weakness and numbness.  All other systems reviewed and are negative.  Physical Exam Updated Vital Signs BP 96/80   Pulse 85   Temp 98.3 F (36.8 C) (Oral)   Resp (!) 29   Ht 5\' 2"  (1.575 m)   Wt 80.7 kg   SpO2 95%   BMI 32.56 kg/m   Physical Exam Vitals and nursing note reviewed.  Constitutional:      General: She is not in acute distress.  Appearance: She is not ill-appearing.  HENT:     Head: Normocephalic.     Comments: No hemotympanum, raccoon eyes, battle sign, malocclusion, septal hematomas.  Small posterior scalp hematoma. Eyes:     Pupils: Pupils are equal, round, and reactive to light.  Neck:     Comments: Mild cervical midline tenderness Cardiovascular:     Rate and Rhythm: Normal rate and regular rhythm.     Pulses: Normal pulses.     Heart sounds: Normal heart sounds. No murmur heard.   No friction rub. No gallop.  Pulmonary:     Effort: Pulmonary effort is normal.     Breath sounds: Normal breath sounds.  Abdominal:     General: Abdomen is flat. There is no distension.     Palpations: Abdomen is soft.     Tenderness: There is no abdominal tenderness. There is no guarding or rebound.  Musculoskeletal:        General: Normal range of motion.     Cervical back: Neck supple.     Comments: No thoracic or lumbar midline tenderness  Skin:    General: Skin is warm and dry.  Neurological:     General: No focal deficit present.     Mental Status: She is alert.     Comments: Speech is clear, able to follow commands CN III-XII intact Normal strength in upper and lower extremities bilaterally including dorsiflexion and plantar flexion, strong and equal  grip strength Sensation grossly intact throughout Moves extremities without ataxia, coordination intact No pronator drift  Psychiatric:        Mood and Affect: Mood normal.        Behavior: Behavior normal.    ED Results / Procedures / Treatments   Labs (all labs ordered are listed, but only abnormal results are displayed) Labs Reviewed  BASIC METABOLIC PANEL - Abnormal; Notable for the following components:      Result Value   Glucose, Bld 116 (*)    Calcium 8.7 (*)    All other components within normal limits  CBC WITH DIFFERENTIAL/PLATELET  CBC WITH DIFFERENTIAL/PLATELET    EKG None  Radiology CT Head Wo Contrast  Result Date: 03/17/2021 CLINICAL DATA:  Headache and neck pain after falling backward and striking head today EXAM: CT HEAD WITHOUT CONTRAST CT CERVICAL SPINE WITHOUT CONTRAST TECHNIQUE: Multidetector CT imaging of the head and cervical spine was performed following the standard protocol without intravenous contrast. Multiplanar CT image reconstructions of the cervical spine were also generated. COMPARISON:  CT head and cervical spine 07/12/2019 FINDINGS: CT HEAD FINDINGS Brain: Mild atrophy. Normal ventricular morphology. No midline shift or mass effect. Otherwise normal appearance of brain parenchyma. No intracranial hemorrhage, mass lesion, or evidence of acute infarction. No extra-axial fluid collections. Vascular: No hyperdense vessels Skull: Intact. Hyperostosis frontalis interna. LEFT posterior parietal scalp hematoma. Sinuses/Orbits: Clear Other: N/A CT CERVICAL SPINE FINDINGS Alignment: Normal Skull base and vertebrae: Vertebral body and disc space heights maintained. Skull base intact. No fracture, subluxation, or bone destruction. Soft tissues and spinal canal: Prevertebral soft tissues normal thickness. Disc levels:  No specific abnormalities Upper chest: Lung apices clear Other: N/A IMPRESSION: No acute intracranial abnormalities. LEFT posterior parietal scalp  hematoma. No acute cervical spine abnormalities. Electronically Signed   By: Lavonia Dana M.D.   On: 03/17/2021 15:55   CT Cervical Spine Wo Contrast  Result Date: 03/17/2021 CLINICAL DATA:  Headache and neck pain after falling backward and striking head today EXAM: CT HEAD WITHOUT  CONTRAST CT CERVICAL SPINE WITHOUT CONTRAST TECHNIQUE: Multidetector CT imaging of the head and cervical spine was performed following the standard protocol without intravenous contrast. Multiplanar CT image reconstructions of the cervical spine were also generated. COMPARISON:  CT head and cervical spine 07/12/2019 FINDINGS: CT HEAD FINDINGS Brain: Mild atrophy. Normal ventricular morphology. No midline shift or mass effect. Otherwise normal appearance of brain parenchyma. No intracranial hemorrhage, mass lesion, or evidence of acute infarction. No extra-axial fluid collections. Vascular: No hyperdense vessels Skull: Intact. Hyperostosis frontalis interna. LEFT posterior parietal scalp hematoma. Sinuses/Orbits: Clear Other: N/A CT CERVICAL SPINE FINDINGS Alignment: Normal Skull base and vertebrae: Vertebral body and disc space heights maintained. Skull base intact. No fracture, subluxation, or bone destruction. Soft tissues and spinal canal: Prevertebral soft tissues normal thickness. Disc levels:  No specific abnormalities Upper chest: Lung apices clear Other: N/A IMPRESSION: No acute intracranial abnormalities. LEFT posterior parietal scalp hematoma. No acute cervical spine abnormalities. Electronically Signed   By: Lavonia Dana M.D.   On: 03/17/2021 15:55    Procedures Procedures   Medications Ordered in ED Medications  acetaminophen (TYLENOL) tablet 650 mg (650 mg Oral Given 03/17/21 1444)  ketorolac (TORADOL) 30 MG/ML injection 30 mg (30 mg Intramuscular Given 03/17/21 1628)    ED Course  I have reviewed the triage vital signs and the nursing notes.  Pertinent labs & imaging results that were available during my care  of the patient were reviewed by me and considered in my medical decision making (see chart for details).  Clinical Course as of 03/17/21 1736  Tue Mar 17, 2021  1708 Glucose(!): 116 [CA]    Clinical Course User Index [CA] Suzy Bouchard, PA-C   MDM Rules/Calculators/A&P                         75 year old female presents to the ED after mechanical fall striking the posterior aspect of her head.  Patient has a history of Parkinson's.  No loss of consciousness.  She is not currently on any blood thinners.  Upon arrival, stable vitals.  Patient nontoxic-appearing.  Normal neurological exam.  Small posterior scalp hematoma.  No signs of basilar skull fracture.  CT head and cervical spine ordered to rule out acute abnormalities.  Routine labs ordered.  Tylenol given for pain.  CT head and cervical spine personally reviewed which is negative for any acute abnormalities.  BMP reassuring with normal renal function and no major electrolyte derangements.  Mild hyperglycemia at 116 with no anion gap.  EKG personally reviewed which demonstrates normal sinus rhythm with no signs of acute ischemia. Patient able to ambulate in the ED without difficulty. Advised patient to take over-the-counter ibuprofen or Tylenol as needed for pain.  Encourage patient to follow-up with PCP within the next week for further evaluation. Strict ED precautions discussed with patient. Patient states understanding and agrees to plan. Patient discharged home in no acute distress and stable vitals  Discussed case with Dr. Reather Converse who agrees with assessment and plan.  Final Clinical Impression(s) / ED Diagnoses Final diagnoses:  Injury of head, initial encounter  Fall, initial encounter    Rx / DC Orders ED Discharge Orders     None        Karie Kirks 03/17/21 1738    Elnora Morrison, MD 03/19/21 1554

## 2021-04-02 ENCOUNTER — Telehealth: Payer: Self-pay | Admitting: Neurology

## 2021-04-02 NOTE — Telephone Encounter (Signed)
Called and spoke to patients caregiver Deborah Vincent and he stated that patient is all better now. He states that patient had an eventful day and needed to relax. They laid her down and put on meditation music which help patient a lot. Informed Deborah Vincent on what Dr. Carles Collet thinks may be going on and her recommendations. Deborah Vincent stated that he will call us if they have any more issues but patient is doing much better and the movements have improved.

## 2021-04-02 NOTE — Telephone Encounter (Signed)
Patients caregiver called stating that patient started experiencing a uncontrollable movement in her neck in which it is constantly moving. She isn't sure what to do.

## 2021-04-02 NOTE — Telephone Encounter (Signed)
It could be dyskinesia (not sure since I am basing on description).  She has had that in the feet before.  If so, I wouldn't recommend any treatment for it.  Treatment often causes more confusion and dyskinesia usually bothers family/caregivers more than patients

## 2021-04-15 ENCOUNTER — Other Ambulatory Visit: Payer: Self-pay | Admitting: Neurology

## 2021-04-23 ENCOUNTER — Encounter: Payer: Self-pay | Admitting: Neurology

## 2021-05-13 ENCOUNTER — Other Ambulatory Visit: Payer: Self-pay | Admitting: Neurology

## 2021-05-29 ENCOUNTER — Other Ambulatory Visit: Payer: Self-pay | Admitting: Physician Assistant

## 2021-05-29 DIAGNOSIS — R1084 Generalized abdominal pain: Secondary | ICD-10-CM

## 2021-06-04 ENCOUNTER — Ambulatory Visit
Admission: RE | Admit: 2021-06-04 | Discharge: 2021-06-04 | Disposition: A | Discharge status: Home / Self Care | Payer: Medicare Other | Source: Ambulatory Visit | Attending: Physician Assistant | Admitting: Physician Assistant

## 2021-06-04 DIAGNOSIS — R1084 Generalized abdominal pain: Secondary | ICD-10-CM

## 2021-06-04 MED ORDER — IOPAMIDOL (ISOVUE-300) INJECTION 61%
100.0000 mL | Freq: Once | INTRAVENOUS | Status: AC | PRN
  Administered 2021-06-04: 100 mL via INTRAVENOUS

## 2021-06-05 ENCOUNTER — Other Ambulatory Visit: Payer: Self-pay | Admitting: Neurology

## 2021-06-05 DIAGNOSIS — G20 Parkinson's disease: Secondary | ICD-10-CM

## 2021-06-09 ENCOUNTER — Other Ambulatory Visit: Payer: Self-pay

## 2021-06-09 ENCOUNTER — Other Ambulatory Visit: Payer: Self-pay | Admitting: Physician Assistant

## 2021-06-09 DIAGNOSIS — R112 Nausea with vomiting, unspecified: Secondary | ICD-10-CM

## 2021-06-11 ENCOUNTER — Other Ambulatory Visit: Payer: Self-pay | Admitting: Neurology

## 2021-06-11 DIAGNOSIS — G20 Parkinson's disease: Secondary | ICD-10-CM

## 2021-06-17 ENCOUNTER — Other Ambulatory Visit: Payer: Medicare Other

## 2021-06-19 ENCOUNTER — Other Ambulatory Visit: Payer: Medicare Other

## 2021-06-22 ENCOUNTER — Ambulatory Visit
Admission: RE | Admit: 2021-06-22 | Discharge: 2021-06-22 | Disposition: A | Discharge status: Home / Self Care | Payer: Medicare Other | Source: Ambulatory Visit | Attending: Physician Assistant | Admitting: Physician Assistant

## 2021-06-22 DIAGNOSIS — R112 Nausea with vomiting, unspecified: Secondary | ICD-10-CM

## 2021-06-23 ENCOUNTER — Inpatient Hospital Stay: Admission: RE | Admit: 2021-06-23 | Payer: Medicare Other | Source: Ambulatory Visit

## 2021-06-29 ENCOUNTER — Ambulatory Visit: Payer: Medicare Other | Admitting: Cardiovascular Disease

## 2021-07-03 ENCOUNTER — Ambulatory Visit: Payer: Medicare Other | Admitting: Neurology

## 2021-07-03 ENCOUNTER — Other Ambulatory Visit: Payer: Self-pay | Admitting: Neurology

## 2021-07-03 DIAGNOSIS — G20 Parkinson's disease: Secondary | ICD-10-CM

## 2021-07-07 ENCOUNTER — Ambulatory Visit: Payer: Medicare Other | Admitting: Cardiovascular Disease

## 2021-07-09 NOTE — Progress Notes (Signed)
Assessment/Plan:   1.  Parkinsons Disease  -Continue carbidopa/levodopa 25/100, 1 tablet at 6am/9am/noon/3pm/6pm/9pm.    -Continue carbidopa/levodopa 50/200 but she is taking that at midnight -  Thinks less cramping that way  -Continue rasagiline, 1 mg daily  -There was a refill error on the patient's entacapone in August, and it was refilled for entacapone, 200 mg, half tablet with first 3 dosages of levodopa.  I am going to try and check on where that error occurred and call her back.  Likely will change her back to entacapone, 200 mg, 1 tablet with first 3 dosages of carbidopa/levodopa 25/100  -[facility] to [facility] at [facility]  2.  Depression  -Has been a great influence her in physical and mental health,  -encouraged her to get back to counseling.  We will send a referral to [facility]   4.  Chronic nausea and constipation  -We have been able to prove that the nausea is not from Parkinson's or its medications.  Medications have been completely reworked without success.  -on linzess.    -GI RX hyoscamine for cramping.  Need to watch doesn't make cramping worse  -I have seen somewhat of a correlation between abdominal pain/nausea/depression  5.  Urinary retention  -Follows with urology.  On Flomax  6.  RBD  -rarely  7.  Mild Parkinsons Disease dyskinesia  -Discussed nature and pathophysiology.  Not too bothersome to patient.     Subjective:   Deborah Vincent was seen today in follow up for Parkinsons disease.  My previous records were reviewed prior to todays visit as well as outside records available to me.  medical records reviewed since last visit.  Patient in the emergency room in June.  She was reaching for her walker and fell backwards and hit her head on the concrete.  CT brain was negative intracranially (left scalp hematoma).  Has had 2 more falls - one on 9/26 - states that she was turning too fast - just "stretched her neck" but didn't hit head.  The  following day, she did the same thing - turned too fast.  Hit the L hip.  Pain didn't last more than a day.  Asks for referral to [facility].  States that hasn't been back to counselor b/c feels that he was too young.  Would like to get back to counseling though.  Fatigue increased - taking 30 min to 1 hour nap twice per day.  Rare acting out the dreams.  Working on getting down on xanax - q 2 days now.    Current prescribed movement disorder medications: carbidopa/levodopa 25/100, 1 tablet at 6am/9am/noon/3pm/6pm/9pm Carbidopa/levodopa 50/200 at midnight Entacapone 200 mg, 1 tablet with first 3 dosages of levodopa (refill error and only on 100 mg tid) Rasagiline, 1 mg daily  ALLERGIES:   Allergies  Allergen Reactions   Nubain [Nalbuphine Hcl] Anaphylaxis   Ciprofloxacin Hives   Macrobid [Nitrofurantoin Macrocrystal] Nausea Only   Naproxen Hives   Sulfa Antibiotics Hives and Itching   Vicodin [Hydrocodone-Acetaminophen] Other (See Comments)    Overuse in past-prefers not to take   Other Other (See Comments) and Rash    PULLS SKIN OFF ; PAPER TAPE OK TO USE    CURRENT MEDICATIONS:  Outpatient Encounter Medications as of 07/10/2021  Medication Sig   acetaminophen (TYLENOL) 325 MG tablet Take 650 mg by mouth daily as needed for mild pain or moderate pain.    ALPRAZolam (XANAX) 0.5 MG tablet Take 1 tablet (  0.5 mg total) by mouth 3 (three) times daily as needed for anxiety.   anastrozole (ARIMIDEX) 1 MG tablet Take 1 mg by mouth at bedtime.    aspirin EC 325 MG tablet Take 325 mg by mouth in the morning and at bedtime.   buPROPion (WELLBUTRIN) 100 MG tablet Take 100 mg by mouth 2 (two) times daily.   carbidopa-levodopa (SINEMET CR) 50-200 MG tablet TAKE 1 TABLET BY MOUTH EVERYDAY AT BEDTIME   carbidopa-levodopa (SINEMET IR) 25-100 MG tablet Take 1 tablet by mouth 6 (six) times daily. 6am/9am/12pm/3pm/6pm/9pm   diclofenac sodium (VOLTAREN) 1 % GEL Apply 1 application topically 4 (four) times  daily as needed (knee pain).    entacapone (COMTAN) 200 MG tablet TAKE 0.5 TABLETS (100 MG TOTAL) BY MOUTH 3 (THREE) TIMES DAILY.   Lidocaine 4 % PTCH Apply 1 patch topically 2 (two) times daily as needed (pain).   linaclotide (LINZESS) 145 MCG CAPS capsule Take 145 mcg by mouth daily before breakfast.   lisinopril (ZESTRIL) 5 MG tablet Take 2.5 mg by mouth in the morning and at bedtime.    nystatin cream (MYCOSTATIN) Apply 1 application topically 2 (two) times daily as needed for dry skin.   pantoprazole (PROTONIX) 40 MG tablet Take 1 tablet by mouth daily.   polyethylene glycol (MIRALAX / GLYCOLAX) 17 g packet Take 17 g by mouth daily.   RASAGILINE MESYLATE PO Take 1 mg by mouth daily.   tamsulosin (FLOMAX) 0.4 MG CAPS capsule Take 0.4 mg by mouth daily as needed.   traMADol (ULTRAM) 50 MG tablet Take 1 tablet (50 mg total) by mouth every 6 (six) hours as needed.   Docusate Calcium (STOOL SOFTENER PO) Take 1 tablet by mouth daily. (Patient not taking: Reported on 07/10/2021)   [DISCONTINUED] phenylephrine-shark liver oil-mineral oil-petrolatum (PREPARATION H) 0.25-3-14-71.9 % rectal ointment Place 1 application rectally 2 (two) times daily as needed for hemorrhoids. (Patient not taking: No sig reported)   No facility-administered encounter medications on file as of 07/10/2021.    Objective:   PHYSICAL EXAMINATION:    VITALS:   Vitals:   07/10/21 1503  BP: 119/79  Pulse: 82  SpO2: 97%  Weight: 167 lb 12.8 oz (76.1 kg)  Height: 5\' 2"  (1.575 m)     GEN:  The patient appears stated age and is in NAD.   HEENT:  Normocephalic, atraumatic.  The mucous membranes are moist. The superficial temporal arteries are without ropiness or tenderness. CV:  RRR Lungs:  CTAB Neck/HEME:  There are no carotid bruits bilaterally.  Neurological examination:  Orientation: The patient is alert and oriented x3. Cranial nerves: There is good facial symmetry with facial hypomimia. The speech is fluent  and clear. Soft palate rises symmetrically and there is no tongue deviation. Hearing is intact to conversational tone. Sensation: Sensation is intact to light touch throughout Motor: Strength is at least antigravity x4.  Movement examination: Tone: There is nl tone in the ue/le Abnormal movements: she has some dyskinesia of both feet, but it is mild, and similar to last visit. Coordination:  There is no decremation with RAM's Gait and Station: Patient able to arise OOC.  She walks fairly well without the walker, although she really does need to think about it.  I have reviewed and interpreted the following labs independently    Chemistry      Component Value Date/Time   NA 137 03/17/2021 1615   K 4.2 03/17/2021 1615   CL 104 03/17/2021 1615  CO2 25 03/17/2021 1615   BUN 15 03/17/2021 1615   CREATININE 0.92 03/17/2021 1615      Component Value Date/Time   CALCIUM 8.7 (L) 03/17/2021 1615   ALKPHOS 78 04/01/2020 2151   AST 11 (L) 04/01/2020 2151   ALT 6 04/01/2020 2151   BILITOT 0.7 04/01/2020 2151       Lab Results  Component Value Date   WBC 10.5 03/17/2021   HGB 13.6 03/17/2021   HCT 42.7 03/17/2021   MCV 93.8 03/17/2021   PLT 234 03/17/2021    No results found for: TSH   Total time spent on today's visit was 41 minutes, including both face-to-face time and nonface-to-face time.  Time included that spent on review of records (prior notes available to me/labs/imaging if pertinent), discussing treatment and goals, answering patient's questions and coordinating care.  Cc:  [name], MD

## 2021-07-10 ENCOUNTER — Ambulatory Visit: Payer: Medicare Other | Admitting: Neurology

## 2021-07-10 ENCOUNTER — Encounter: Payer: Self-pay | Admitting: Neurology

## 2021-07-10 ENCOUNTER — Other Ambulatory Visit: Payer: Self-pay

## 2021-07-10 VITALS — BP 119/79 | HR 82 | Ht 62.0 in | Wt 167.8 lb

## 2021-07-10 DIAGNOSIS — G249 Dystonia, unspecified: Secondary | ICD-10-CM | POA: Diagnosis not present

## 2021-07-10 DIAGNOSIS — G20 Parkinson's disease: Secondary | ICD-10-CM | POA: Diagnosis not present

## 2021-07-10 DIAGNOSIS — F32A Depression, unspecified: Secondary | ICD-10-CM

## 2021-07-13 ENCOUNTER — Telehealth: Payer: Self-pay | Admitting: Neurology

## 2021-07-13 ENCOUNTER — Other Ambulatory Visit: Payer: Self-pay

## 2021-07-13 DIAGNOSIS — G20 Parkinson's disease: Secondary | ICD-10-CM

## 2021-07-13 MED ORDER — ENTACAPONE 200 MG PO TABS: ORAL_TABLET | ORAL | 0 refills | Status: DC

## 2021-07-13 NOTE — Telephone Encounter (Signed)
Let pt know that we have been in contact multiple times with the pharmacy regarding the 1/2 tablet of entacapone and why they sent a request for that dosage when patient had always been on 1 po tid with first three dosages of levodopa.  We inappropriately/ inaccurately allowed that to be filled.  Pharmacy will not send [place] a printout of the meds from that time.  Let pt know that I will have her go back to what it was supposed to be:  1 with first three dosages of levodopa. Please send new RX for 200 mg tab with first 3 dosages of levodopa, #270,. RF 2

## 2021-07-14 NOTE — Telephone Encounter (Signed)
Called patient and explained what we know that has happened with her medication and that I sent the correct prescription in for her. Called pharmacy and made sure they discontinued the  Incorrect prescription. Patient agreed and had no further questions at this time

## 2021-08-06 ENCOUNTER — Telehealth: Payer: Self-pay

## 2021-08-06 NOTE — Telephone Encounter (Signed)
Pt called in having a  Dystonia Fair up it started around 12 after she took her noon medication she started having jerking in her neck and drawing in her neck shoulders and feet. Care giver was with her and put baclofen cream on her and gave her a heating pad.  Pt was in a full blown panic attack when she called the office pt was talked through deep breathing to slow her breathing down because she was hyperventilating, looking at her medication list she has PRN xanax pt informed that she can take her PRN medication, pt advised that Dr Tat was out of the office but I would send this note to her and one of the doctors would see this and I would call her back. Care giver stated that she would be there until 5 but was worried about leaving her.

## 2021-08-07 NOTE — Telephone Encounter (Signed)
Pt called she is doing much better today

## 2021-08-07 NOTE — Telephone Encounter (Signed)
Pt called to follow up on how she is doing no answer left a voice mail to call the office back

## 2021-10-01 ENCOUNTER — Telehealth: Payer: Self-pay | Admitting: Neurology

## 2021-10-01 NOTE — Telephone Encounter (Signed)
Patient called about a referral to Dr. Casimiro Needle  he is not taking new patients at this time according to the referral it was declined back in Nov. She wants to speak to someone about the referral or if she should cont to see a Dr that she has been seeing for the Wellbutrin   Please call

## 2021-10-02 NOTE — Telephone Encounter (Signed)
Returned call to patient and let her know Dr. Carles Collet from her notes appears she would like patient to receive counseling rather than just med management. Patient stated there is a counseling program at the current Dr. Gabriel Carina she goes to for the Wellbutrin and she has done counseling there in the apt and can begin to set up something with them. I let patient know the difficulty I have had with local psychiatry and the challenges to get a patient in especially at the end of the year. We discussed I would look for an opening but she will set up counseling until then. Patient also mentioned she has been told by family that she is having Paranoid thoughts patient didn't feel she was but her family mentioned it and I wanted to document that.

## 2021-11-06 ENCOUNTER — Ambulatory Visit: Payer: Medicare Other | Admitting: Behavioral Health

## 2021-11-14 ENCOUNTER — Other Ambulatory Visit: Payer: Self-pay | Admitting: Neurology

## 2021-11-14 DIAGNOSIS — G20 Parkinson's disease: Secondary | ICD-10-CM

## 2021-11-16 ENCOUNTER — Other Ambulatory Visit: Payer: Self-pay

## 2021-12-16 ENCOUNTER — Encounter: Payer: Self-pay | Admitting: Behavioral Health

## 2021-12-16 ENCOUNTER — Telehealth: Payer: Self-pay | Admitting: Neurology

## 2021-12-16 ENCOUNTER — Other Ambulatory Visit: Payer: Self-pay

## 2021-12-16 ENCOUNTER — Ambulatory Visit (INDEPENDENT_AMBULATORY_CARE_PROVIDER_SITE_OTHER): Payer: Medicare Other | Admitting: Behavioral Health

## 2021-12-16 VITALS — BP 115/75 | HR 99 | Ht 61.0 in | Wt 156.0 lb

## 2021-12-16 DIAGNOSIS — F419 Anxiety disorder, unspecified: Secondary | ICD-10-CM | POA: Diagnosis not present

## 2021-12-16 DIAGNOSIS — F330 Major depressive disorder, recurrent, mild: Secondary | ICD-10-CM

## 2021-12-16 DIAGNOSIS — F331 Major depressive disorder, recurrent, moderate: Secondary | ICD-10-CM

## 2021-12-16 MED ORDER — BUPROPION HCL ER (XL) 300 MG PO TB24: 300.0000 mg | ORAL_TABLET | Freq: Every day | ORAL | 3 refills | Status: DC

## 2021-12-16 NOTE — Telephone Encounter (Signed)
Pt called in stating she is having some dystonia symptoms. She had never had them before until recently. She seems to notice the symptoms around 3 PM each day. She stopped taking pantoprazole today in case that was causing it. ?

## 2021-12-16 NOTE — Telephone Encounter (Signed)
Called patient and left voicemail to call me back at office to discuss further her symptoms  ?

## 2021-12-16 NOTE — Progress Notes (Signed)
[facility] MD/PA/NP Initial Note  12/16/2021 5:03 PM Deborah Vincent  MRN:  [id number]  Chief Complaint:  Chief Complaint   Anxiety; Depression; Follow-up; Medication Problem     HPI:   76 year old female presents to this office for initial visit and to establish care. She said her previous psychiatric provider left practice in [place] and that she is currently looking for new one to manage her medication. She is seeing neurology regularly for Parkinson's disease.She has two care givers coming to her home to assist with care.  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 She does not want to make many changes to her medications and agrees to adjust the Wellbutrin. She says that she practices positive coping  mechanisms and breathing techniques for anxiety. She stopped taking her xanax. She says her anxiety is 2/10 and depression is 3/10 today. She denies mania, no psychosis or delirium. No auditory or visual hallucinations. No SI/HI.   She is unable to provide list of prior psychiatric medications at this time.     Visit Diagnosis:    ICD-10-CM   1. Recurrent moderate major depressive disorder with anxiety (HCC)  F33.1 buPROPion (WELLBUTRIN XL) 300 MG 24 hr tablet   F41.9     2. MDD (major depressive disorder), recurrent episode, mild (HCC)  F33.0 buPROPion (WELLBUTRIN XL) 300 MG 24 hr tablet      Past Psychiatric History: Anxiety, MDD  Past Medical History:  Past Medical History:  Diagnosis Date   Breast cancer (HCC)    Breast Cancer   Depression    Hyperlipidemia    Hypertension    Neuropathy    Osteoarthritis    Parkinson's disease (HCC)    Urinary tract infection     Past Surgical History:  Procedure Laterality Date   ABDOMINAL EXPLORATION SURGERY  1999   intestinal adhesions   ABDOMINAL HYSTERECTOMY  1982    BREAST LUMPECTOMY Left 04/03/2012   CHOLECYSTECTOMY N/A 06/13/2019   Procedure: LAPAROSCOPIC CHOLECYSTECTOMY;  Surgeon: Manus Rudd, MD;  Location: [facility] OR;  Service: General;  Laterality: N/A;   INTRAOPERATIVE CHOLANGIOGRAM N/A 06/13/2019   Procedure: Attempted Intraoperative Cholangiogram;  Surgeon: Manus Rudd, MD;  Location: [facility];  Service: General;  Laterality: N/A;  Attempted unable to perform   LAPAROSCOPIC LYSIS OF ADHESIONS N/A 06/13/2019   Procedure: Laparoscopic Lysis Of Adhesions;  Surgeon: Manus Rudd, MD;  Location: [facility] OR;  Service: General;  Laterality: N/A;   left mastectomy     MASTECTOMY Left 04/28/2012   RENAL ANGIOPLASTY Left 1992   REVISION TOTAL KNEE ARTHROPLASTY      Family Psychiatric History: see chart  Family History:  Family History  Problem Relation Age of Onset   Cancer Mother        GI    Hypertension Mother    Depression Mother    Alcohol abuse Mother    Lung cancer Father    Alcohol abuse Father    Other Sister        Wagner's granuloma    Social History:  Social History   Socioeconomic History   Marital status: Unknown    Spouse name: Not on file   Number of children: 2   Years of education: 18   Highest education level: Not on file  Occupational History   Occupation: retired    Comment:  nurse practitioner  Tobacco Use   Smoking status: Never   Smokeless tobacco: Never  Vaping Use   Vaping Use: Never used  Substance and Sexual Activity   Alcohol use: No    Comment: prior alcohol dependence   Drug use: Not Currently    Types: Marijuana    Comment: in the 1960's   Sexual activity: Not Currently  Other Topics Concern   Not on file  Social History Narrative   Lives alone in [place]. 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    Social Determinants of Health   Financial Resource Strain: Not on file  Food Insecurity: Not on file  Transportation Needs: Not on file  Physical Activity: Not on file   Stress: Not on file  Social Connections: Not on file    Allergies:  Allergies  Allergen Reactions   Nubain [Nalbuphine Hcl] Anaphylaxis   Ciprofloxacin Hives   Macrobid [Nitrofurantoin Macrocrystal] Nausea Only   Naproxen Hives   Sulfa Antibiotics Hives and Itching   Vicodin [Hydrocodone-Acetaminophen] Other (See Comments)    Overuse in past-prefers not to take   Other Other (See Comments) and Rash    PULLS SKIN OFF ; PAPER TAPE OK TO USE    Metabolic Disorder Labs: No results found for: HGBA1C, MPG No results found for: PROLACTIN No results found for: CHOL, TRIG, HDL, CHOLHDL, VLDL, LDLCALC No results found for: TSH  Therapeutic Level Labs: No results found for: LITHIUM No results found for: VALPROATE No components found for:  CBMZ  Current Medications: Current Outpatient Medications  Medication Sig Dispense Refill   acetaminophen (TYLENOL) 325 MG tablet Take 650 mg by mouth daily as needed for mild pain or moderate pain.      anastrozole (ARIMIDEX) 1 MG tablet Take 1 mg by mouth at bedtime.      buPROPion (WELLBUTRIN XL) 300 MG 24 hr tablet Take 1 tablet (300 mg total) by mouth daily. 30 tablet 3   buPROPion (WELLBUTRIN) 100 MG tablet Take 100 mg by mouth 2 (two) times daily.     carbidopa-levodopa (SINEMET CR) 50-200 MG tablet TAKE 1 TABLET BY MOUTH EVERYDAY AT BEDTIME 90 tablet 0   carbidopa-levodopa (SINEMET IR) 25-100 MG tablet Take 1 tablet by mouth 6 (six) times daily. 6am/9am/12pm/3pm/6pm/9pm     diclofenac sodium (VOLTAREN) 1 % GEL Apply 1 application topically 4 (four) times daily as needed (knee pain).      Docusate Calcium (STOOL SOFTENER PO) Take 1 tablet by mouth daily.     entacapone (COMTAN) 200 MG tablet Take 1 tablet 3 times a day with the first three doses of Carbidopa Levodopa 120 tablet 0   linaclotide (LINZESS) 145 MCG CAPS capsule Take 145 mcg by mouth daily before breakfast.     lisinopril (ZESTRIL) 5 MG tablet Take 2.5 mg by mouth in the morning  and at bedtime.      nystatin cream (MYCOSTATIN) Apply 1 application topically 2 (two) times daily as needed for dry skin.     pantoprazole (PROTONIX) 40 MG tablet Take 1 tablet by mouth daily.     polyethylene glycol (MIRALAX / GLYCOLAX) 17 g packet Take 17 g by mouth daily.     RASAGILINE MESYLATE PO Take 1 mg by mouth daily.     tamsulosin (FLOMAX) 0.4 MG CAPS capsule Take 0.4 mg by mouth daily as needed.     traMADol (ULTRAM) 50 MG tablet Take 1 tablet (50 mg total) by mouth every  6 (six) hours as needed. 15 tablet 0   ALPRAZolam (XANAX) 0.5 MG tablet Take 1 tablet (0.5 mg total) by mouth 3 (three) times daily as needed for anxiety. (Patient not taking: Reported on 12/16/2021) 90 tablet 0   aspirin EC 325 MG tablet Take 325 mg by mouth in the morning and at bedtime.     Lidocaine 4 % PTCH Apply 1 patch topically 2 (two) times daily as needed (pain). (Patient not taking: Reported on 12/16/2021)     No current facility-administered medications for this visit.    Medication Side Effects: none  Orders placed this visit:  No orders of the defined types were placed in this encounter.   Psychiatric Specialty Exam:  Review of Systems  Constitutional:  Positive for unexpected weight change.  Eyes:  Positive for redness.  Gastrointestinal:  Positive for abdominal pain and diarrhea.  Musculoskeletal:  Positive for back pain.  Allergic/Immunologic: Negative.   Neurological:  Positive for tremors and weakness.  Hematological:  Bruises/bleeds easily.   There were no vitals taken for this visit.There is no height or weight on file to calculate BMI.  General Appearance: Casual, Neat, and Well Groomed  Eye Contact:  Good  Speech:  Clear and Coherent  Volume:  Normal  Mood:  Depressed  Affect:  Depressed and Tearful  Thought Process:  Coherent  Orientation:  Full (Time, Place, and Person)  Thought Content: Logical   Suicidal Thoughts:  No  Homicidal Thoughts:  No  Memory:  WNL  Judgement:   Good  Insight:  Good  Psychomotor Activity:  Normal  Concentration:  Concentration: Good  Recall:  Good  Fund of Knowledge: Good  Language: Good  Assets:  Desire for Improvement  ADL's:  Intact  Cognition: WNL  Prognosis:  Good   Screenings:  PHQ2-9    Flowsheet Row Office Visit from 12/16/2021 in [facility]  PHQ-2 Total Score 1      Flowsheet Row ED from 03/17/2021 in [facility] EMERGENCY DEPARTMENT  C-SSRS RISK CATEGORY No Risk       Receiving Psychotherapy: No   Treatment Plan/Recommendations:   Greater than 50% of  45 min face to face time with patient was spent on counseling and coordination of care. We discussed her long hx of anxiety and depression. We discussed her previous care and treatment plan. She has Parkinson's and is currently under the care of neurology. She believes her anxiety and depression is due to her progression of Parkinson's and she sometimes gets discouraged. She is trying to utilize healthy coping mechanisms and practicing breathing techniques for any anxiety. She is glad she came off the xanax.  She does not want to make radical changes to her medications at this time. Anxiety and depression levels are low and we agreed to tweak her Wellbutrin which she has not changed in about 10 years. She will increase Wellbutrin  to 300 mg XR daily She has already stopped her xanax and is not requesting more Will follow up in 8 weeks to reassess To report worsening symptoms promptly Provided emergency contact information Reviewed PDMP      Joan Flores, NP

## 2021-12-17 ENCOUNTER — Encounter: Payer: Self-pay | Admitting: Neurology

## 2021-12-17 NOTE — Telephone Encounter (Signed)
Called patient and gave recommendations by Dr. Carles Collet. Patient asked about referral and I let her know to call her PCP to get a referral to a new neuro doctor.  ?

## 2021-12-17 NOTE — Telephone Encounter (Signed)
Long discussion with staff (was informed about phone call yesterday when not in office and discussed today when returned to office as well).  Daughter was screaming at staff and speaking inappropriately and insulting staff.  Unfortunately, this is not the first time pt/family has perceived an urgent need and they have contacted [place] and then they have done inappropriate things (several years ago pts female caregiver/? Boyfriend called during a similar crisis and said he was a doctor and needed to speak with me directly in order to get me on the phone).  Most of these have turned out to be panic attacks.  Noted that pt did see behavioral medicine yesterday.  Noted that pts daughter apparently said several times to my MA that they wanted another neurologist and were looking for one. ? ?Due to inappropriate behavior (bordering on abuse of staff), I am going to unfortunately have to d/c the patient from our clinic.  The physician/patient trust has been broken. ? ?Chelsea, please contact patient this AM (only speak with patient) and find out if she is better today.  If she feels (as she did yesterday) that this is a panic attack, she just saw behavioral health yesterday and needs to contact them.  Please let her know that I have appreciated caring for her but will not be able to do that in the future (will do so for 30 days emergently) due to behavior during yesterdays phone call.  They will receive a letter in that regard from [place].   ?

## 2021-12-17 NOTE — Telephone Encounter (Addendum)
Called patient to ask how she was feeling today.Patient was feeling normal this morning with no signs of the symptoms she was having yesterday. I let patient  know she is being released from our office. Daughter attempted to speak to me to apologize for her behavior I did tell patient I can only speak to the patient . She asked about her entacapone causing the dyskinesia she was suffering yesterday after talking to several pharmacist she has now gotten the impression that this medication caused her issue. I did tell patient I am only allowed to speak to her and only for emergencies for 30 days. That she would receive a letter about this  ?

## 2021-12-28 ENCOUNTER — Telehealth: Payer: Self-pay | Admitting: Neurology

## 2021-12-28 NOTE — Telephone Encounter (Signed)
Patient dismissed from [facility] 12/17/21 ?

## 2022-01-08 ENCOUNTER — Other Ambulatory Visit: Payer: Self-pay | Admitting: Behavioral Health

## 2022-01-08 DIAGNOSIS — F331 Major depressive disorder, recurrent, moderate: Secondary | ICD-10-CM

## 2022-01-08 DIAGNOSIS — F330 Major depressive disorder, recurrent, mild: Secondary | ICD-10-CM

## 2022-01-13 ENCOUNTER — Ambulatory Visit: Payer: Medicare Other | Admitting: Neurology

## 2022-01-25 ENCOUNTER — Other Ambulatory Visit: Payer: Self-pay | Admitting: Behavioral Health

## 2022-01-25 ENCOUNTER — Telehealth: Payer: Self-pay | Admitting: Behavioral Health

## 2022-01-25 MED ORDER — BUPROPION HCL 100 MG PO TABS: 100.0000 mg | ORAL_TABLET | Freq: Two times a day (BID) | ORAL | 1 refills | Status: DC

## 2022-01-25 NOTE — Telephone Encounter (Signed)
Pt called at 10:35 am and said that she is on rasagiline '1mg'$  and you increased her wellbutrin. She said that she can't take the wellbutrin with the rasgaline. So she needs to know what to do can you possible go back down to the original dose of wellbutrin. Please call her at [phone] ?

## 2022-01-25 NOTE — Telephone Encounter (Signed)
Patient said rasagiline is an MAO inhibitor and is not supposed to be taken with Wellbutrin. She has an appt with neurologist on 5/15 to see about stopping the rasagiline. She is asking if she should decrease the Wellbutrin.  Please advise.  ?

## 2022-01-25 NOTE — Progress Notes (Signed)
Followed up with patient via phone call. She expressed concern about increased dose of Wellbutrin to 300 mg XL and taking Rasagilene at the same time. She said that she has been on the two drugs for a very long time but thought about the concern between the two drugs. I advised her to return to her original dose of Wellbutrin and consult with her Neurologist. She said that she would like to come off of the Rasagiline because she believed it caused dystonia.  She agreed to follow up with this office after consulting with neurology. She says that she has been on Wellbutrin so long that she does not want to stop the medication unless she absolutely has too.  ? ?Lesle Chris, NP ?

## 2022-01-25 NOTE — Telephone Encounter (Signed)
Followed up with pt directly. She had been on the two drugs together since 2016. She will be reducing the Wellbutrin back down too her original dose and follow up with her neurologist.

## 2022-01-28 ENCOUNTER — Ambulatory Visit: Payer: Medicare Other | Admitting: Neurology

## 2022-01-28 ENCOUNTER — Encounter: Payer: Self-pay | Admitting: Neurology

## 2022-01-28 VITALS — BP 143/85 | HR 93 | Ht 61.0 in | Wt 155.0 lb

## 2022-01-28 DIAGNOSIS — G20 Parkinson's disease: Secondary | ICD-10-CM | POA: Diagnosis not present

## 2022-01-28 DIAGNOSIS — K5909 Other constipation: Secondary | ICD-10-CM | POA: Diagnosis not present

## 2022-01-28 DIAGNOSIS — G249 Dystonia, unspecified: Secondary | ICD-10-CM

## 2022-01-28 DIAGNOSIS — G20B1 Parkinson's disease with dyskinesia, without mention of fluctuations: Secondary | ICD-10-CM

## 2022-01-28 DIAGNOSIS — G20A1 Parkinson's disease without dyskinesia, without mention of fluctuations: Secondary | ICD-10-CM

## 2022-01-28 MED ORDER — CARBIDOPA-LEVODOPA 25-100 MG PO TABS: 1.0000 | ORAL_TABLET | Freq: Every day | ORAL | 3 refills | Status: DC

## 2022-01-28 MED ORDER — CARBIDOPA-LEVODOPA ER 50-200 MG PO TBCR: 1.0000 | EXTENDED_RELEASE_TABLET | Freq: Every day | ORAL | 3 refills | Status: DC

## 2022-01-28 NOTE — Progress Notes (Signed)
Subjective:  ?  ?Patient ID: Deborah Vincent is a 76 y.o. female. ? ?HPI ? ? ? ?Deborah Age, MD, PhD ?[facility] ?[address], [address] ?[phone] ?[place], [place] [address] ? ?Dear Dr. [place] Vincent, ?  ?I saw your patient, Deborah Vincent, upon your kind request, in my neurologic clinic today for evaluation of Parkinson's disease.  The patient is accompanied by Deborah Vincent, her caretaker today.  As you know, Deborah Vincent is a 76 year old right-handed woman with an underlying medical history of breast cancer, status postmastectomy, currently on Arimidex, reflux disease, hypertension, vitamin D deficiency, hyperlipidemia, depression, chronic constipation, osteoarthritis, status post left knee replacement, and borderline obesity, who reports having recent issues with involuntary movements in her legs.  She stopped her Comtan in March 2023.  She also reports that she had probably interaction or side effects from her other medications at the time including her tramadol which has been stopped in the interim as well as Xanax which is also discontinued.  She reports that her psychiatrist wants to change her Wellbutrin but there is an interaction between Azilect and Wellbutrin and she would like to come off the Azilect.  She is currently on Wellbutrin 100 mg twice daily and has been on Azilect 1 mg once daily generic for years.  She has not fallen in the past 6+ months.  She has a rolling walker but did not bring it today and reports that she walks without it typically.  She had physical therapy several months ago but not recently.  Probably last had physical therapy around December 2022.  She tries to hydrate well with water.   ? ?She she has caretakers, Deborah Vincent typically they are on the weekends, Friday, Saturday, and Sunday for about 8 hours starting in the late morning.  Patient also has 2 brothers that check on her on Mondays and Tuesdays.  Her kids live out of state.  She has a daughter and a son in  [place] and a son in [place].   ? ?She does not have anybody that stays overnight.  She does not drive a car and has not driven in about 3 years. ? ?She has been followed by Dr. Wells Guiles Vincent until March 2023; she was dismissed from the office.  I had evaluated her once over 3-1/2 years ago.  She was in the process of moving from [place] to [place], but had also seen Dr. Carles Vincent in December 2018.  She was diagnosed with Parkinson's disease in 2015.  She had right-sided symptoms at the time. ? ?She has been on Azilect once daily, Sinemet CR once at night, entacapone 200 mg 3 times daily and generic Sinemet IR 1 pill 6 times a day starting at 6 AM, 3 hourly.  She has seen a counselor for anxiety and depression.  She was on Xanax 0.5 mg strength 1 pill 3 times daily as needed.  She also takes bupropion 100 mg twice daily. ? ?I reviewed your office note from 10/21/2021. She was last seen by Dr. Carles Vincent on 07/10/2021 and I reviewed the note.  She was advised to continue with her current doses of Sinemet IR, Sinemet CR, rasagiline, and entacapone at 200 mg 3 times daily.  [facility]  She was encouraged to go back to counseling.  She was referred to [facility] behavioral medicine. ?  ?Her Parkinson's disease is complicated by constipation for which she is on Linzess, she has had dyskinesias.  She has  struggled with anxiety. ? ? ?Previously:  ? ?05/30/18: 76 year old right-handed woman with an underlying medical history of hypertension, breast cancer with status post mastectomy, low back pain, asthma, depression, insomnia, and obesity, who was diagnosed with Parkinson's disease in or around 2012. She has been on Azilect, Sinemet generic and Mirapex generic. She was seen last year by Dr. Carles Vincent at [facility] neurology and I reviewed the note from 09/22/2017. Patient was advised to start levodopa at the time. ?She is currently on Sinemet generic 25-100 milligrams strength one pill 3 times a day, Azilect  generic 1 mg strength one pill once daily, Mirapex generic 0.25 mg strength one pill 3 times a day. ?She has a Hx of pseudobulbar affect. Sleeps okay, wakes up 2 times a night for a bathroom break. Takes at least one nap during the day. Feels very nauseated. Started the Sinemet in 10/2017. Started the [place] in May 2016, Mirapex was started in November 2016. She has noted nausea after a while of taking the C/L. She is taking Mirapex 0.25 mg at 3 AM, noon and 8 PM. She takes Sinemet at 3 AM, 7 AM, 11 AM, 4 PM and 8 or 9 PM. She takes the Azilect in the morning typically between 8 and 10. She is in physical therapy currently. She does not typically go to the gym. She has low back pain and bilateral knee pain, left more than right. She has had issues with constipation. This became severely worse when she tried Imodium for nausea in the past. She has no family history of Parkinson's disease. She currently lives with a roommate and stays with her nephew in [place]. She is retired, widowed, has 3 children, she is a nonsmoker. She drinks caffeine in the form of coffee, half cup per day on average. She tries to hydrate well. She feels that her nausea typically comes on before she is due for her next Sinemet dose. The nausea becomes worse after she takes it for about 20 minutes and then subsides, she feels the Sinemet work well for her.  ?  ? ?Her Past Medical History Is Significant For: ?Past Medical History:  ?Diagnosis Date  ? Breast cancer ([place])   ? Breast Cancer  ? Depression   ? Hyperlipidemia   ? Hypertension   ? Neuropathy   ? Osteoarthritis   ? Parkinson's disease ([place])   ? Urinary tract infection   ? ? ?Her Past Surgical History Is Significant For: ?Past Surgical History:  ?Procedure Laterality Date  ? ABDOMINAL EXPLORATION SURGERY  1999  ? intestinal adhesions  ? ABDOMINAL HYSTERECTOMY  1982  ? BREAST LUMPECTOMY Left 04/03/2012  ? CHOLECYSTECTOMY N/A 06/13/2019  ? Procedure: LAPAROSCOPIC CHOLECYSTECTOMY;   Surgeon: Donnie Mesa, MD;  Location: [place];  Service: General;  Laterality: N/A;  ? INTRAOPERATIVE CHOLANGIOGRAM N/A 06/13/2019  ? Procedure: Attempted Intraoperative Cholangiogram;  Surgeon: Donnie Mesa, MD;  Location: [place];  Service: General;  Laterality: N/A;  Attempted unable to perform  ? LAPAROSCOPIC LYSIS OF ADHESIONS N/A 06/13/2019  ? Procedure: Laparoscopic Lysis Of Adhesions;  Surgeon: Donnie Mesa, MD;  Location: [place];  Service: General;  Laterality: N/A;  ? left mastectomy    ? MASTECTOMY Left 04/28/2012  ? RENAL ANGIOPLASTY Left 1992  ? REVISION TOTAL KNEE ARTHROPLASTY    ? TOTAL KNEE REVISION Left   ? 03-26-2020 Total Knee Replacement  ? ? ?Her Family History Is Significant For: ?Family History  ?Problem Relation Vincent of Onset  ? Cancer Mother   ?  GI   ? Hypertension Mother   ? Depression Mother   ? Alcohol abuse Mother   ? Lung cancer Father   ? Alcohol abuse Father   ? Other Sister   ?     Wagner's granuloma  ? ? ?Her Social History Is Significant For: ?Social History  ? ?Socioeconomic History  ? Marital status: Unknown  ?  Spouse name: Not on file  ? Number of children: 2  ? Years of education: 74  ? Highest education level: Not on file  ?Occupational History  ? 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 Smoking status: Never  ? Smokeless tobacco: Never  ?Vaping Use  ? Vaping Use: Never used  ?Substance and Sexual Activity  ? Alcohol use: No  ?  Comment: prior alcohol dependence  ? Drug use: Not Currently  ?  Types: Marijuana  ?  Comment: in the 1960's  ? Sexual activity: Not Currently  ?Other Topics Concern  ? Not on file  ?Social History Narrative  ? Lives alone in [place]. 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   ? ?Social Determinants of Health  ? ?Financial Resource Strain: Not on file  ?Food Insecurity: Not on file  ?Transportation Needs: Not on file  ?Physical Activity: Not on file  ?Stress: Not on file  ?Social Connections: Not  on file  ? ? ?Her Allergies Are:  ?Allergies  ?Allergen Reactions  ? Nubain [Nalbuphine Hcl] Anaphylaxis  ? Ciprofloxacin Hives  ? Macrobid [Nitrofurantoin Macrocrystal] Nausea Only  ? Naproxen Hives  ? Sulfa Ant

## 2022-01-28 NOTE — Patient Instructions (Signed)
Due to potential interaction with Wellbutrin, we will have you taper off the Azilect by reducing to half a pill once daily for 2 weeks and then stop it altogether.  You can continue with your immediate release levodopa at 1 pill 6 times a day and long-acting levodopa at 1 pill at bedtime.  You can stay off the entacapone. ?

## 2022-02-15 ENCOUNTER — Encounter: Payer: Self-pay | Admitting: Behavioral Health

## 2022-02-15 ENCOUNTER — Ambulatory Visit (INDEPENDENT_AMBULATORY_CARE_PROVIDER_SITE_OTHER): Payer: Medicare Other | Admitting: Behavioral Health

## 2022-02-15 DIAGNOSIS — F331 Major depressive disorder, recurrent, moderate: Secondary | ICD-10-CM

## 2022-02-15 DIAGNOSIS — F411 Generalized anxiety disorder: Secondary | ICD-10-CM

## 2022-02-15 DIAGNOSIS — F419 Anxiety disorder, unspecified: Secondary | ICD-10-CM | POA: Diagnosis not present

## 2022-02-15 DIAGNOSIS — F330 Major depressive disorder, recurrent, mild: Secondary | ICD-10-CM | POA: Diagnosis not present

## 2022-02-15 MED ORDER — BUPROPION HCL 100 MG PO TABS: 100.0000 mg | ORAL_TABLET | Freq: Two times a day (BID) | ORAL | 3 refills | Status: DC

## 2022-02-15 NOTE — Progress Notes (Signed)
Deborah Vincent ?[id number] ?12-Aug-1946 ?76 y.o. ? ?Virtual Visit via Telephone Note ? ?I connected with pt on 02/15/22 at  3:00 PM EDT by telephone and verified that I am speaking with the correct person using two identifiers. ?  ?I discussed the limitations, risks, security and privacy concerns of performing an evaluation and management service by telephone and the availability of in person appointments. I also discussed with the patient that there may be a patient responsible charge related to this service. The patient expressed understanding and agreed to proceed. ?  ?I discussed the assessment and treatment plan with the patient. The patient was provided an opportunity to ask questions and all were answered. The patient agreed with the plan and demonstrated an understanding of the instructions. ?  ?The patient was advised to call back or seek an in-person evaluation if the symptoms worsen or if the condition fails to improve as anticipated. ? ?I provided 20 minutes of non-face-to-face time during this encounter.  The patient was located at home.  The provider was located at [place]. ? ? ?Elwanda Brooklyn, NP ? ? ?Subjective:  ? ?Patient ID:  Deborah Vincent is a 76 y.o. (DOB 1945-11-06) female. ? ?Chief Complaint: No chief complaint on file. ? ? ?HPI ? ?76 year old female presents to this office for follow up and medication management. She is having a little bit of anxiety due to trying to make a decision about moving to [place]. Her daughter wants her to move there, but she is concerned about the relationship stability of her daughter and worries about being displaced again.  She does not want to make many changes to her medications at this time. She says that she practices positive coping  mechanisms and breathing techniques for anxiety. She says she will follow up with her counselor. She stopped taking her xanax. She says her anxiety is 3/10 and depression is 3/10 today. She denies mania,  no psychosis or delirium. No auditory or visual hallucinations. No SI/HI.  ?  ?She is unable to provide list of prior psychiatric medications at this time. ?  ? ? ?Review of Systems:  ?Review of Systems  ?Constitutional: Negative.   ?Cardiovascular:  Negative for palpitations.  ?Allergic/Immunologic: Negative.   ?Neurological:  Negative for tremors and weakness.  ?Psychiatric/Behavioral:  The patient is nervous/anxious.   ? ?Medications: I have reviewed the patient's current medications. ? ?Current Outpatient Medications  ?Medication Sig Dispense Refill  ? acetaminophen (TYLENOL) 500 MG tablet Take 500 mg by mouth daily as needed.    ? anastrozole (ARIMIDEX) 1 MG tablet Take 1 mg by mouth at bedtime.     ? aspirin EC 325 MG tablet Take 325 mg by mouth in the morning and at bedtime.    ? buPROPion (WELLBUTRIN) 100 MG tablet Take 1 tablet (100 mg total) by mouth 2 (two) times daily. 60 tablet 3  ? carbidopa-levodopa (SINEMET CR) 50-200 MG tablet Take 1 tablet by mouth at bedtime. 90 tablet 3  ? carbidopa-levodopa (SINEMET IR) 25-100 MG tablet Take 1 tablet by mouth 6 (six) times daily. 6am/9am/12pm/3pm/6pm/9pm 540 tablet 3  ? CRANBERRY PO Take 2 capsules by mouth daily.    ? diclofenac sodium (VOLTAREN) 1 % GEL Apply 1 application topically 4 (four) times daily as needed (knee pain).     ? Docusate Calcium (STOOL SOFTENER PO) Take 1 tablet by mouth daily.    ? Lidocaine 4 % PTCH Apply 1 patch topically 2 (two) times  daily as needed (pain).    ? linaclotide (LINZESS) 145 MCG CAPS capsule Take 290 mcg by mouth daily before breakfast.    ? lisinopril (ZESTRIL) 5 MG tablet Take 2.5 mg by mouth in the morning and at bedtime.     ? LITTLE TUMMYS FIBER GUMMIES PO Take by mouth. 1/2 tsp po twice daily    ? Melatonin 5 MG TBDP See admin instructions. Takes 9pm, 1200AM    ? nystatin cream (MYCOSTATIN) Apply 1 application topically 2 (two) times daily as needed for dry skin.    ? OVER THE COUNTER MEDICATION ZENWISE  takes 1 cap  three times daily prior to food.    ? pantoprazole (PROTONIX) 40 MG tablet Take 1 tablet by mouth daily.    ? Peppermint Oil (IBGARD) 90 MG CPCR Take by mouth 2 (two) times daily.    ? polyethylene glycol (MIRALAX / GLYCOLAX) 17 g packet Take 17 g by mouth daily.    ? Probiotic Product (FORTIFY PROBIOTIC WOMENS PO) Take 1 tablet by mouth daily.    ? tamsulosin (FLOMAX) 0.4 MG CAPS capsule Take 0.4 mg by mouth daily as needed.    ? ?No current facility-administered medications for this visit.  ? ? ?Medication Side Effects: None ? ?Allergies:  ?Allergies  ?Allergen Reactions  ? Nubain [Nalbuphine Hcl] Anaphylaxis  ? Ciprofloxacin Hives  ? Macrobid [Nitrofurantoin Macrocrystal] Nausea Only  ? Naproxen Hives  ? Sulfa Antibiotics Hives and Itching  ? Vicodin [Hydrocodone-Acetaminophen] Other (See Comments)  ?  Overuse in past-prefers not to take  ? Other Other (See Comments) and Rash  ?  PULLS SKIN OFF ; PAPER TAPE OK TO USE  ? ? ?Past Medical History:  ?Diagnosis Date  ? Breast cancer ([place])   ? Breast Cancer  ? Depression   ? Hyperlipidemia   ? Hypertension   ? Neuropathy   ? Osteoarthritis   ? Parkinson's disease ([place])   ? Urinary tract infection   ? ? ?Family History  ?Problem Relation Age of Onset  ? Cancer Mother   ?     GI   ? Hypertension Mother   ? Depression Mother   ? Alcohol abuse Mother   ? Lung cancer Father   ? Alcohol abuse Father   ? Other Sister   ?     Wagner's granuloma  ? ? ?Social History  ? ?Socioeconomic History  ? Marital status: Unknown  ?  Spouse name: Not on file  ? Number of children: 2  ? Years of education: 98  ? Highest education level: Not on file  ?Occupational History  ? 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 Smoking status: Never  ? Smokeless tobacco: Never  ?Vaping Use  ? Vaping Use: Never used  ?Substance and Sexual Activity  ? Alcohol use: No  ?  Comment: prior alcohol dependence  ? Drug use: Not Currently  ?  Types: Marijuana  ?  Comment: in the 1960's   ? Sexual activity: Not Currently  ?Other Topics Concern  ? Not on file  ?Social History Narrative  ? Lives alone in [place]. 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   ? ?Social Determinants of Health  ? ?Financial Resource Strain: Not on file  ?Food Insecurity: Not on file  ?Transportation Needs: Not on file  ?Physical Activity: Not on file  ?Stress: Not on file  ?Social Connections: Not on file  ?Intimate Partner  Violence: Not on file  ? ? ?Past Medical History, Surgical history, Social history, and Family history were reviewed and updated as appropriate.  ? ?Please see review of systems for further details on the patient's review from today.  ? ?Objective:  ? ?Physical Exam:  ?There were no vitals taken for this visit. ? ?Physical Exam ?Neurological:  ?   Mental Status: She is alert and oriented to person, place, and time.  ?Psychiatric:     ?   Attention and Perception: Attention and perception normal.     ?   Mood and Affect: Mood normal.     ?   Speech: Speech normal.     ?   Behavior: Behavior normal. Behavior is cooperative.     ?   Cognition and Memory: Cognition and memory normal.     ?   Judgment: Judgment normal.  ?   Comments: Insight intact  ? ? ?Lab Review:  ?   ?Component Value Date/Time  ? NA 137 03/17/2021 1615  ? K 4.2 03/17/2021 1615  ? CL 104 03/17/2021 1615  ? CO2 25 03/17/2021 1615  ? GLUCOSE 116 (H) 03/17/2021 1615  ? BUN 15 03/17/2021 1615  ? CREATININE 0.92 03/17/2021 1615  ? CALCIUM 8.7 (L) 03/17/2021 1615  ? PROT 6.3 (L) 04/01/2020 2151  ? ALBUMIN 3.0 (L) 04/01/2020 2151  ? AST 11 (L) 04/01/2020 2151  ? ALT 6 04/01/2020 2151  ? ALKPHOS 78 04/01/2020 2151  ? BILITOT 0.7 04/01/2020 2151  ? GFRNONAA >60 03/17/2021 1615  ? GFRAA >60 04/01/2020 2151  ? ? ?   ?Component Value Date/Time  ? WBC 10.5 03/17/2021 1704  ? RBC 4.55 03/17/2021 1704  ? HGB 13.6 03/17/2021 1704  ? HCT 42.7 03/17/2021 1704  ? PLT 234 03/17/2021 1704  ? MCV 93.8 03/17/2021 1704  ? MCH 29.9  03/17/2021 1704  ? MCHC 31.9 03/17/2021 1704  ? RDW 13.7 03/17/2021 1704  ? LYMPHSABS 2.2 03/17/2021 1704  ? MONOABS 0.6 03/17/2021 1704  ? EOSABS 0.2 03/17/2021 1704  ? BASOSABS 0.0 03/17/2021 1704  ? ? ?N

## 2022-02-17 ENCOUNTER — Institutional Professional Consult (permissible substitution): Payer: Medicare Other | Admitting: Neurology

## 2022-03-19 ENCOUNTER — Other Ambulatory Visit: Payer: Self-pay | Admitting: Behavioral Health

## 2022-03-19 DIAGNOSIS — F331 Major depressive disorder, recurrent, moderate: Secondary | ICD-10-CM

## 2022-03-19 DIAGNOSIS — F330 Major depressive disorder, recurrent, mild: Secondary | ICD-10-CM

## 2022-03-24 ENCOUNTER — Telehealth: Payer: Self-pay | Admitting: Behavioral Health

## 2022-03-24 NOTE — Telephone Encounter (Signed)
Pt called reporting wellbutrin 12 hrs is keeping her awake at night. Asking about going back on  300 mg in the morning. Contact # [phone]

## 2022-03-25 ENCOUNTER — Other Ambulatory Visit: Payer: Self-pay | Admitting: Behavioral Health

## 2022-03-25 DIAGNOSIS — F411 Generalized anxiety disorder: Secondary | ICD-10-CM

## 2022-03-25 DIAGNOSIS — F419 Anxiety disorder, unspecified: Secondary | ICD-10-CM

## 2022-03-25 DIAGNOSIS — F330 Major depressive disorder, recurrent, mild: Secondary | ICD-10-CM

## 2022-03-26 ENCOUNTER — Telehealth: Payer: Self-pay | Admitting: Behavioral Health

## 2022-03-26 NOTE — Telephone Encounter (Signed)
LVM to rtc 

## 2022-04-01 ENCOUNTER — Other Ambulatory Visit: Payer: Self-pay | Admitting: *Deleted

## 2022-04-01 DIAGNOSIS — G20 Parkinson's disease: Secondary | ICD-10-CM

## 2022-04-01 MED ORDER — CARBIDOPA-LEVODOPA 25-100 MG PO TABS: 1.0000 | ORAL_TABLET | Freq: Every day | ORAL | 3 refills | Status: AC

## 2022-04-01 MED ORDER — CARBIDOPA-LEVODOPA ER 50-200 MG PO TBCR: 1.0000 | EXTENDED_RELEASE_TABLET | Freq: Every day | ORAL | 3 refills | Status: AC

## 2022-04-08 ENCOUNTER — Telehealth: Payer: Self-pay | Admitting: Behavioral Health

## 2022-04-08 NOTE — Telephone Encounter (Signed)
Patient said she seems to be having more depression, more crying on the Wellbutrin 300 mg XL. She said she is sleeping better, but she is also wobbly when getting up in the morning. She wasn't sleeping well on the 100 mg BID dosing. She is asking what to do. She said she has to take her Parkinson meds on an empty stomach and is usually taking the Wellbutrin on an empty stomach as well.

## 2022-04-08 NOTE — Telephone Encounter (Signed)
Pt called and said that she is having problems with the new medicine that brian prescribed. She wants to know can she go back on the original medication. Please call her at [phone]

## 2022-04-08 NOTE — Telephone Encounter (Signed)
Not sure we can do anything differently safely. Ask her if she want to go back on the 100 mg twice daily and she can take second dose a little earlier to see if it does not interfere with sleep. Wellbutrin has nothing to do with her feeling wobbly in the morning, this is probably related to age, and parkinsons, and parkinsons meds.

## 2022-04-09 NOTE — Telephone Encounter (Signed)
Patient notified. She has 100 mg tablets.

## 2022-05-21 ENCOUNTER — Encounter: Payer: Self-pay | Admitting: Behavioral Health

## 2022-05-21 ENCOUNTER — Ambulatory Visit (INDEPENDENT_AMBULATORY_CARE_PROVIDER_SITE_OTHER): Payer: Medicare Other | Admitting: Behavioral Health

## 2022-05-21 DIAGNOSIS — F419 Anxiety disorder, unspecified: Secondary | ICD-10-CM

## 2022-05-21 DIAGNOSIS — F330 Major depressive disorder, recurrent, mild: Secondary | ICD-10-CM | POA: Diagnosis not present

## 2022-05-21 DIAGNOSIS — F411 Generalized anxiety disorder: Secondary | ICD-10-CM | POA: Diagnosis not present

## 2022-05-21 DIAGNOSIS — F331 Major depressive disorder, recurrent, moderate: Secondary | ICD-10-CM | POA: Diagnosis not present

## 2022-05-21 MED ORDER — BUPROPION HCL ER (XL) 300 MG PO TB24: 300.0000 mg | ORAL_TABLET | Freq: Every day | ORAL | 2 refills | Status: DC

## 2022-05-21 NOTE — Progress Notes (Signed)
[facility]  Patient ID: Deborah Vincent,  MRN: [id number]  PCP: [name], MD  Date of Evaluation: 05/21/2022 Time spent:30 minutes  Chief Complaint:  Chief Complaint   Anxiety; Depression; Follow-up; Medication Refill; Medication Problem     HISTORY/CURRENT STATUS: HPI  76 year old female presents to this office for initial visit and to establish care. Says she continues to do well on Wellbutrin. She tried switching back to IR version but not working as well. She reports some crying spells. She would like to try Wellbutrin 300 mg XL version again, but will take medication earlier in the day to prevent sleeplessness. She says her anxiety is 2/10 and depression is 4/10 today. She denies mania, no psychosis or delirium. No auditory or visual hallucinations. No SI/HI.    She is unable to provide list of prior psychiatric medications at this time.   Individual Medical History/ Review of Systems: Changes? :No   Allergies: Nubain [nalbuphine hcl], Ciprofloxacin, Macrobid [nitrofurantoin macrocrystal], Naproxen, Sulfa antibiotics, Vicodin [hydrocodone-acetaminophen], and Other  Current Medications:  Current Outpatient Medications:    buPROPion (WELLBUTRIN XL) 300 MG 24 hr tablet, Take 1 tablet (300 mg total) by mouth daily., Disp: 30 tablet, Rfl: 2   acetaminophen (TYLENOL) 500 MG tablet, Take 500 mg by mouth daily as needed., Disp: , Rfl:    anastrozole (ARIMIDEX) 1 MG tablet, Take 1 mg by mouth at bedtime. , Disp: , Rfl:    aspirin EC 325 MG tablet, Take 325 mg by mouth in the morning and at bedtime., Disp: , Rfl:    carbidopa-levodopa (SINEMET CR) 50-200 MG tablet, Take 1 tablet by mouth at bedtime., Disp: 90 tablet, Rfl: 3   carbidopa-levodopa (SINEMET IR) 25-100 MG tablet, Take 1 tablet by mouth 6 (six) times daily. 6am/9am/12pm/3pm/6pm/9pm, Disp: 540 tablet, Rfl: 3   CRANBERRY PO, Take 2 capsules by mouth daily., Disp: , Rfl:    diclofenac sodium (VOLTAREN) 1 %  GEL, Apply 1 application topically 4 (four) times daily as needed (knee pain). , Disp: , Rfl:    Docusate Calcium (STOOL SOFTENER PO), Take 1 tablet by mouth daily., Disp: , Rfl:    Lidocaine 4 % PTCH, Apply 1 patch topically 2 (two) times daily as needed (pain)., Disp: , Rfl:    linaclotide (LINZESS) 145 MCG CAPS capsule, Take 290 mcg by mouth daily before breakfast., Disp: , Rfl:    lisinopril (ZESTRIL) 5 MG tablet, Take 2.5 mg by mouth in the morning and at bedtime. , Disp: , Rfl:    LITTLE TUMMYS FIBER GUMMIES PO, Take by mouth. 1/2 tsp po twice daily, Disp: , Rfl:    Melatonin 5 MG TBDP, See admin instructions. Takes 9pm, 1200AM, Disp: , Rfl:    nystatin cream (MYCOSTATIN), Apply 1 application topically 2 (two) times daily as needed for dry skin., Disp: , Rfl:    OVER THE COUNTER MEDICATION, ZENWISE  takes 1 cap three times daily prior to food., Disp: , Rfl:    pantoprazole (PROTONIX) 40 MG tablet, Take 1 tablet by mouth daily., Disp: , Rfl:    Peppermint Oil (IBGARD) 90 MG CPCR, Take by mouth 2 (two) times daily., Disp: , Rfl:    polyethylene glycol (MIRALAX / GLYCOLAX) 17 g packet, Take 17 g by mouth daily., Disp: , Rfl:    Probiotic Product (FORTIFY PROBIOTIC WOMENS PO), Take 1 tablet by mouth daily., Disp: , Rfl:    tamsulosin (FLOMAX) 0.4 MG CAPS capsule, Take 0.4 mg by mouth  daily as needed., Disp: , Rfl:  Medication Side Effects: none  Family Medical/ Social History: Changes? No  MENTAL HEALTH EXAM:  There were no vitals taken for this visit.There is no height or weight on file to calculate BMI.  General Appearance: Casual and Well Groomed  Eye Contact:  Good  Speech:  Clear and Coherent  Volume:  Normal  Mood:  Depressed  Affect:  Depressed  Thought Process:  Coherent  Orientation:  Full (Time, Place, and Person)  Thought Content: Logical   Suicidal Thoughts:  No  Homicidal Thoughts:  No  Memory:  WNL  Judgement:  Good  Insight:  Good  Psychomotor Activity:  Normal   Concentration:  Concentration: Good  Recall:  Good  Fund of Knowledge: Good  Language: Good  Assets:  Desire for Improvement  ADL's:  Intact  Cognition: WNL  Prognosis:  Good    DIAGNOSES:    ICD-10-CM   1. Recurrent moderate major depressive disorder with anxiety (HCC)  F33.1 buPROPion (WELLBUTRIN XL) 300 MG 24 hr tablet   F41.9     2. MDD (major depressive disorder), recurrent episode, mild (HCC)  F33.0 buPROPion (WELLBUTRIN XL) 300 MG 24 hr tablet    3. Generalized anxiety disorder  F41.1 buPROPion (WELLBUTRIN XL) 300 MG 24 hr tablet      Receiving Psychotherapy: No    RECOMMENDATIONS:     Greater than 50% of  45 min face to face time with patient was spent on counseling and coordination of care. We discussed her long hx of anxiety and depression. We discussed her previous care and treatment plan. She has Parkinson's and is currently under the care of neurology. She believes her anxiety and depression is due to her progression of Parkinson's and she sometimes gets discouraged. She is trying to utilize healthy coping mechanisms and practicing breathing techniques for any anxiety. She is requesting to try Wellbutrin 300 mg XL again but will try taking earlier in the day to prevent insomnia. She is still using walker to ambulate and can sit and stand without assistance. Has to arrange transportation for appointments.  To continue  Wellbutrin  to 300 mg XR daily She has already stopped her xanax and is not requesting more Will follow up in 3 months  reassess To report worsening symptoms promptly Provided emergency contact information Reviewed [place], NP

## 2022-05-25 ENCOUNTER — Ambulatory Visit: Payer: Medicare Other | Admitting: Neurology

## 2022-05-31 ENCOUNTER — Ambulatory Visit: Payer: Medicare Other | Admitting: Adult Health

## 2022-05-31 ENCOUNTER — Encounter: Payer: Self-pay | Admitting: Adult Health

## 2022-05-31 VITALS — BP 134/94 | HR 89 | Ht 61.0 in | Wt 146.0 lb

## 2022-05-31 DIAGNOSIS — G20 Parkinson's disease: Secondary | ICD-10-CM

## 2022-05-31 NOTE — Progress Notes (Signed)
PATIENT: Deborah Vincent DOB: Feb 26, 1946  REASON FOR VISIT: follow up HISTORY FROM: patient PRIMARY NEUROLOGIST: Dr. Rexene Alberts  Chief Complaint  Patient presents with   Follow-up    Rm 19,  Juliann Pulse- caregiver. PD.  Has tapered off rasagaline.  Has noticed hesitations, shuffling, and one fall. [place] on buttocks- well cushioned). ST memory.       HISTORY OF PRESENT ILLNESS: Today 05/31/22:  Deborah Vincent is a 76 year old female with a history of Parkinson disease.  She returns today for follow-up.  Since she stopped azliect she has notice increased symptoms during her down time. No longer having involuntary movements since she stopped the medication. Tremor in the upper extremities. Uses a walker at home. Reports a fall 2 days ago. This is the first one in a while. No significant injury. Appetite varies- reports d/t IBS. Will be seeing GI soon. No trouble chewing or swallowing food.  Sleeping ok. Often wakes up between 2-4 AM but is able to go back to sleep.   She also mentions that her primary care recently sent off a stool sample as she believes she has been seeing parasites in her stool.  The first sample came back negative she is waiting for the results of the second sample   HISTORY Deborah Vincent is a 76 year old right-handed woman with an underlying medical history of breast cancer, status postmastectomy, currently on Arimidex, reflux disease, hypertension, vitamin D deficiency, hyperlipidemia, depression, chronic constipation, osteoarthritis, status post left knee replacement, and borderline obesity, who reports having recent issues with involuntary movements in her legs.  She stopped her Comtan in March 2023.  She also reports that she had probably interaction or side effects from her other medications at the time including her tramadol which has been stopped in the interim as well as Xanax which is also discontinued.  She reports that her psychiatrist wants to change her Wellbutrin but  there is an interaction between Azilect and Wellbutrin and she would like to come off the Azilect.  She is currently on Wellbutrin 100 mg twice daily and has been on Azilect 1 mg once daily generic for years.  She has not fallen in the past 6+ months.  She has a rolling walker but did not bring it today and reports that she walks without it typically.  She had physical therapy several months ago but not recently.  Probably last had physical therapy around December 2022.  She tries to hydrate well with water.     She she has caretakers, T[place] typically they are on the weekends, Friday, Saturday, and Sunday for about 8 hours starting in the late morning.  Patient also has 2 brothers that check on her on Mondays and Tuesdays.  Her kids live out of state.  She has a daughter and a son in [place] and a son in [place].     She does not have anybody that stays overnight.  She does not drive a car and has not driven in about 3 years.   She has been followed by Dr. Wells Guiles Tat until March 2023; she was dismissed from the office.  I had evaluated her once over 3-1/2 years ago.  She was in the process of moving from [place] to [place], but had also seen Dr. Carles Collet in December 2018.  She was diagnosed with Parkinson's disease in 2015.  She had right-sided symptoms at the time.   She has been on Azilect once daily, Sinemet CR once at night, entacapone  200 mg 3 times daily and generic Sinemet IR 1 pill 6 times a day starting at 6 AM, 3 hourly.  She has seen a counselor for anxiety and depression.  She was on Xanax 0.5 mg strength 1 pill 3 times daily as needed.  She also takes bupropion 100 mg twice daily.   I reviewed your office note from 10/21/2021. She was last seen by Dr. Carles Collet on 07/10/2021 and I reviewed the note.  She was advised to continue with her current doses of Sinemet IR, Sinemet CR, rasagiline, and entacapone at 200 mg 3 times daily.  [facility]  She was encouraged to go  back to counseling.  She was referred to [facility].   Her Parkinson's disease is complicated by constipation for which she is on Linzess, she has had dyskinesias.  She has struggled with anxiety.    REVIEW OF SYSTEMS: Out of a complete 14 system review of symptoms, the patient complains only of the following symptoms, and all other reviewed systems are negative.  ALLERGIES: Allergies  Allergen Reactions   Nubain [Nalbuphine Hcl] Anaphylaxis   Ciprofloxacin Hives   Macrobid [Nitrofurantoin Macrocrystal] Nausea Only   Naproxen Hives   Sulfa Antibiotics Hives and Itching   Vicodin [Hydrocodone-Acetaminophen] Other (See Comments)    Overuse in past-prefers not to take   Other Other (See Comments) and Rash    PULLS SKIN OFF ; PAPER TAPE OK TO USE    HOME MEDICATIONS: Outpatient Medications Prior to Visit  Medication Sig Dispense Refill   acetaminophen (TYLENOL) 500 MG tablet Take 500 mg by mouth daily as needed.     anastrozole (ARIMIDEX) 1 MG tablet Take 1 mg by mouth at bedtime.      aspirin EC 325 MG tablet Take 325 mg by mouth in the morning and at bedtime.     buPROPion (WELLBUTRIN XL) 300 MG 24 hr tablet Take 1 tablet (300 mg total) by mouth daily. 30 tablet 2   carbidopa-levodopa (SINEMET CR) 50-200 MG tablet Take 1 tablet by mouth at bedtime. 90 tablet 3   carbidopa-levodopa (SINEMET IR) 25-100 MG tablet Take 1 tablet by mouth 6 (six) times daily. 6am/9am/12pm/3pm/6pm/9pm 540 tablet 3   CRANBERRY PO Take 2 capsules by mouth daily.     diclofenac sodium (VOLTAREN) 1 % GEL Apply 1 application topically 4 (four) times daily as needed (knee pain).      Docusate Calcium (STOOL SOFTENER PO) Take 1 tablet by mouth daily.     Lidocaine 4 % PTCH Apply 1 patch topically 2 (two) times daily as needed (pain).     linaclotide (LINZESS) 145 MCG CAPS capsule Take 290 mcg by mouth daily before breakfast.     lisinopril (ZESTRIL) 5 MG tablet Take 2.5 mg by mouth in the morning  and at bedtime.      LITTLE TUMMYS FIBER GUMMIES PO Take by mouth. 1/2 tsp po twice daily     Melatonin 5 MG TBDP See admin instructions. Takes 9pm, 1200AM     nystatin cream (MYCOSTATIN) Apply 1 application topically 2 (two) times daily as needed for dry skin.     OVER THE COUNTER MEDICATION ZENWISE  takes 1 cap three times daily prior to food.     pantoprazole (PROTONIX) 40 MG tablet Take 1 tablet by mouth daily.     Peppermint Oil (IBGARD) 90 MG CPCR Take by mouth 2 (two) times daily.     polyethylene glycol (MIRALAX /  GLYCOLAX) 17 g packet Take 17 g by mouth daily.     Probiotic Product (FORTIFY PROBIOTIC WOMENS PO) Take 1 tablet by mouth daily.     tamsulosin (FLOMAX) 0.4 MG CAPS capsule Take 0.4 mg by mouth daily as needed.     No facility-administered medications prior to visit.    PAST MEDICAL HISTORY: Past Medical History:  Diagnosis Date   Breast cancer ([place])    Breast Cancer   Depression    Hyperlipidemia    Hypertension    Neuropathy    Osteoarthritis    Parkinson's disease ([place])    Urinary tract infection     PAST SURGICAL HISTORY: Past Surgical History:  Procedure Laterality Date   ABDOMINAL EXPLORATION SURGERY  1999   intestinal adhesions   ABDOMINAL HYSTERECTOMY  1982   BREAST LUMPECTOMY Left 04/03/2012   CHOLECYSTECTOMY N/A 06/13/2019   Procedure: LAPAROSCOPIC CHOLECYSTECTOMY;  Surgeon: Donnie Mesa, MD;  Location: [facility];  Service: General;  Laterality: N/A;   INTRAOPERATIVE CHOLANGIOGRAM N/A 06/13/2019   Procedure: Attempted Intraoperative Cholangiogram;  Surgeon: Donnie Mesa, MD;  Location: [place];  Service: General;  Laterality: N/A;  Attempted unable to perform   LAPAROSCOPIC LYSIS OF ADHESIONS N/A 06/13/2019   Procedure: Laparoscopic Lysis Of Adhesions;  Surgeon: Donnie Mesa, MD;  Location: [facility];  Service: General;  Laterality: N/A;   left mastectomy     MASTECTOMY Left 04/28/2012   RENAL ANGIOPLASTY Left 1992   REVISION TOTAL KNEE  ARTHROPLASTY     TOTAL KNEE REVISION Left    03-26-2020 Total Knee Replacement    FAMILY HISTORY: Family History  Problem Relation Age of Onset   Cancer Mother        GI    Hypertension Mother    Depression Mother    Alcohol abuse Mother    Lung cancer Father    Alcohol abuse Father    Other Sister        Wagner's granuloma    SOCIAL HISTORY: Social History   Socioeconomic History   Marital status: Unknown    Spouse name: Not on file   Number of children: 2   Years of education: 18   Highest education level: Not on file  Occupational History   Occupation: retired    Comment: Designer, jewellery  Tobacco Use   Smoking status: Never   Smokeless tobacco: Never  Vaping Use   Vaping Use: Never used  Substance and Sexual Activity   Alcohol use: No    Comment: prior alcohol dependence   Drug use: Not Currently    Types: Marijuana    Comment: in the 1960's   Sexual activity: Not Currently  Other Topics Concern   Not on file  Social History Narrative   Lives alone in [place]. 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    Social Determinants of Health   Financial Resource Strain: Not on file  Food Insecurity: Not on file  Transportation Needs: Not on file  Physical Activity: Not on file  Stress: Not on file  Social Connections: Not on file  Intimate Partner Violence: Not on file      PHYSICAL EXAM  Vitals:   05/31/22 1302  BP: (!) 134/94  Pulse: 89  Weight: 146 lb (66.2 kg)  Height: '5\' 1"'$  (1.549 m)   Body mass index is 27.59 kg/m.  Generalized: Well developed, in no acute distress   Neurological examination  Mentation: Alert oriented to time, place, history taking.  Follows all commands speech and language fluent Cranial nerve II-XII: Pupils were equal round reactive to light. Extraocular movements were full, visual field were full on confrontational test. Facial sensation and strength were normal. Head turning and shoulder shrug   were normal and symmetric. Motor: The motor testing reveals 5 over 5 strength of all 4 extremities. Good symmetric motor tone is noted throughout.  Sensory: Sensory testing is intact to soft touch on all 4 extremities. No evidence of extinction is noted.  Mild impairment of finger taps bilaterally.  Moderate impairment of toe taps bilaterally Coordination: Cerebellar testing reveals good finger-nose-finger and heel-to-shin bilaterally.  Gait and station: Patient in a wheelchair today.  She did not bring her walker with her Reflexes: Deep tendon reflexes are symmetric and normal bilaterally.   DIAGNOSTIC DATA (LABS, IMAGING, TESTING) - I reviewed patient records, labs, notes, testing and imaging myself where available.  Lab Results  Component Value Date   WBC 10.5 03/17/2021   HGB 13.6 03/17/2021   HCT 42.7 03/17/2021   MCV 93.8 03/17/2021   PLT 234 03/17/2021      Component Value Date/Time   NA 137 03/17/2021 1615   K 4.2 03/17/2021 1615   CL 104 03/17/2021 1615   CO2 25 03/17/2021 1615   GLUCOSE 116 (H) 03/17/2021 1615   BUN 15 03/17/2021 1615   CREATININE 0.92 03/17/2021 1615   CALCIUM 8.7 (L) 03/17/2021 1615   PROT 6.3 (L) 04/01/2020 2151   ALBUMIN 3.0 (L) 04/01/2020 2151   AST 11 (L) 04/01/2020 2151   ALT 6 04/01/2020 2151   ALKPHOS 78 04/01/2020 2151   BILITOT 0.7 04/01/2020 2151   GFRNONAA >60 03/17/2021 1615   GFRAA >60 04/01/2020 2151     ASSESSMENT AND PLAN 76 y.o. year old female  has a past medical history of Breast cancer ([place]), Depression, Hyperlipidemia, Hypertension, Neuropathy, Osteoarthritis, Parkinson's disease ([place]), and Urinary tract infection. here with:  1.  Parkinson's disease  -Continue Sinemet IR 25-100 mg 6 times a day -Continue Sinemet CR 25-100 mg at bedtime -Encouraged the patient to stay well-hydrated -Advised if her symptoms worsen or she develops new symptoms she should let us know -Follow-up in 6 months or sooner if  needed     Ward Givens, MSN, NP-C 05/31/2022, 12:26 PM [facility] [address], [place], [place] [address] [phone]

## 2022-05-31 NOTE — Patient Instructions (Signed)
Your Plan:  Continue sinemet IR 6 times a day and sinemet CR at bedtime If your symptoms worsen or you develop new symptoms please let us know.   Thank you for coming to see [place] at [facility]. I hope we have been able to provide you high quality care today.  You may receive a patient satisfaction survey over the next few weeks. We would appreciate your feedback and comments so that we may continue to improve ourselves and the health of our patients.

## 2022-06-01 ENCOUNTER — Encounter: Payer: Self-pay | Admitting: Adult Health

## 2022-06-09 ENCOUNTER — Other Ambulatory Visit (HOSPITAL_COMMUNITY): Payer: Self-pay | Admitting: Internal Medicine

## 2022-06-09 DIAGNOSIS — Z1231 Encounter for screening mammogram for malignant neoplasm of breast: Secondary | ICD-10-CM

## 2022-06-10 ENCOUNTER — Other Ambulatory Visit (HOSPITAL_COMMUNITY): Payer: Self-pay | Admitting: Internal Medicine

## 2022-06-10 ENCOUNTER — Inpatient Hospital Stay
Admission: RE | Admit: 2022-06-10 | Discharge: 2022-06-10 | Disposition: A | Discharge status: Home / Self Care | Payer: Self-pay | Source: Ambulatory Visit | Attending: Internal Medicine | Admitting: Internal Medicine

## 2022-06-10 ENCOUNTER — Ambulatory Visit
Admission: RE | Admit: 2022-06-10 | Discharge: 2022-06-10 | Disposition: A | Discharge status: Home / Self Care | Payer: Self-pay | Source: Ambulatory Visit | Attending: Internal Medicine | Admitting: Internal Medicine

## 2022-06-10 DIAGNOSIS — Z1231 Encounter for screening mammogram for malignant neoplasm of breast: Secondary | ICD-10-CM

## 2022-06-13 ENCOUNTER — Other Ambulatory Visit: Payer: Self-pay | Admitting: Behavioral Health

## 2022-06-13 DIAGNOSIS — F330 Major depressive disorder, recurrent, mild: Secondary | ICD-10-CM

## 2022-06-13 DIAGNOSIS — F411 Generalized anxiety disorder: Secondary | ICD-10-CM

## 2022-06-13 DIAGNOSIS — F331 Major depressive disorder, recurrent, moderate: Secondary | ICD-10-CM

## 2022-06-16 ENCOUNTER — Ambulatory Visit
Admission: RE | Admit: 2022-06-16 | Discharge: 2022-06-16 | Disposition: A | Discharge status: Home / Self Care | Payer: Medicare Other | Source: Ambulatory Visit | Attending: Physician Assistant | Admitting: Physician Assistant

## 2022-06-16 ENCOUNTER — Other Ambulatory Visit: Payer: Self-pay | Admitting: Physician Assistant

## 2022-06-16 DIAGNOSIS — K5909 Other constipation: Secondary | ICD-10-CM

## 2022-06-16 DIAGNOSIS — R1032 Left lower quadrant pain: Secondary | ICD-10-CM

## 2022-06-16 MED ORDER — IOPAMIDOL (ISOVUE-300) INJECTION 61%
100.0000 mL | Freq: Once | INTRAVENOUS | Status: AC | PRN
Start: 2022-06-16 — End: 2022-06-16
  Administered 2022-06-16: 100 mL via INTRAVENOUS

## 2022-06-28 ENCOUNTER — Ambulatory Visit (HOSPITAL_COMMUNITY)
Admission: RE | Admit: 2022-06-28 | Discharge: 2022-06-28 | Disposition: A | Discharge status: Home / Self Care | Payer: Medicare Other | Source: Ambulatory Visit | Attending: Internal Medicine | Admitting: Internal Medicine

## 2022-06-28 DIAGNOSIS — Z1231 Encounter for screening mammogram for malignant neoplasm of breast: Secondary | ICD-10-CM | POA: Insufficient documentation

## 2022-07-07 ENCOUNTER — Encounter: Payer: Self-pay | Admitting: Behavioral Health

## 2022-07-07 ENCOUNTER — Ambulatory Visit (INDEPENDENT_AMBULATORY_CARE_PROVIDER_SITE_OTHER): Payer: Medicare Other | Admitting: Behavioral Health

## 2022-07-07 DIAGNOSIS — F419 Anxiety disorder, unspecified: Secondary | ICD-10-CM

## 2022-07-07 DIAGNOSIS — F331 Major depressive disorder, recurrent, moderate: Secondary | ICD-10-CM

## 2022-07-07 DIAGNOSIS — F411 Generalized anxiety disorder: Secondary | ICD-10-CM

## 2022-07-07 DIAGNOSIS — F330 Major depressive disorder, recurrent, mild: Secondary | ICD-10-CM

## 2022-07-07 MED ORDER — BUPROPION HCL ER (XL) 300 MG PO TB24: 300.0000 mg | ORAL_TABLET | Freq: Every day | ORAL | 1 refills | Status: AC

## 2022-07-07 NOTE — Progress Notes (Signed)
[facility]  Patient ID: Deborah Vincent,  MRN: [id number]  PCP: [name], MD  Date of Evaluation: 07/07/2022 Time spent:20 minutes  Chief Complaint:  Chief Complaint   Anxiety; Depression; Follow-up; Medication Refill; Patient Education     HISTORY/CURRENT STATUS: HPI  76 year old female presents to this office for follow up and medication management.  Says she continues to do well on Wellbutrin. Says she did have a what she calls visual hallucination and delusional thinking recently. Says that she realizes it now but says that she thought she saw moving parasites in her stool. But after stool sample was tested and came back negative she started to understand that she had become fixated with loss of some reality.  She understands that this could be her Parkinson's advancing. She does not want to add another medication and agrees to monitor. She does have caregivers to assist and will be moving in with her daughter in [place] in December. She has not had any more crying spells since last visit.  She has tolerated Wellbutrin XR well. She says her anxiety is 3/10 and depression is 4/10 today. She denies mania, no psychosis or delirium. No auditory or visual hallucinations. No SI/HI.    Individual Medical History/ Review of Systems: Changes? :No   Allergies: Nubain [nalbuphine hcl], Ciprofloxacin, Macrobid [nitrofurantoin macrocrystal], Naproxen, Sulfa antibiotics, Vicodin [hydrocodone-acetaminophen], and Other  Current Medications:  Current Outpatient Medications:    acetaminophen (TYLENOL) 500 MG tablet, Take 500 mg by mouth daily as needed., Disp: , Rfl:    anastrozole (ARIMIDEX) 1 MG tablet, Take 1 mg by mouth at bedtime. , Disp: , Rfl:    aspirin EC 325 MG tablet, Take 325 mg by mouth in the morning and at bedtime., Disp: , Rfl:    buPROPion (WELLBUTRIN XL) 300 MG 24 hr tablet, Take 1 tablet (300 mg total) by mouth daily., Disp: 90 tablet, Rfl: 1    carbidopa-levodopa (SINEMET CR) 50-200 MG tablet, Take 1 tablet by mouth at bedtime., Disp: 90 tablet, Rfl: 3   carbidopa-levodopa (SINEMET IR) 25-100 MG tablet, Take 1 tablet by mouth 6 (six) times daily. 6am/9am/12pm/3pm/6pm/9pm, Disp: 540 tablet, Rfl: 3   diclofenac sodium (VOLTAREN) 1 % GEL, Apply 1 application topically 4 (four) times daily as needed (knee pain). , Disp: , Rfl:    Docusate Calcium (STOOL SOFTENER PO), Take 1 tablet by mouth daily., Disp: , Rfl:    linaclotide (LINZESS) 145 MCG CAPS capsule, Take 290 mcg by mouth daily before breakfast., Disp: , Rfl:    lisinopril (ZESTRIL) 5 MG tablet, Take 2.5 mg by mouth in the morning and at bedtime. , Disp: , Rfl:    Melatonin 5 MG TBDP, See admin instructions. Takes 9pm, 1200AM, Disp: , Rfl:    nystatin cream (MYCOSTATIN), Apply 1 application topically 2 (two) times daily as needed for dry skin., Disp: , Rfl:    OVER THE COUNTER MEDICATION, ZENWISE  takes 1 cap three times daily prior to food., Disp: , Rfl:    Peppermint Oil (IBGARD) 90 MG CPCR, Take by mouth 2 (two) times daily., Disp: , Rfl:    polyethylene glycol (MIRALAX / GLYCOLAX) 17 g packet, Take 17 g by mouth daily., Disp: , Rfl:    Probiotic Product (FORTIFY PROBIOTIC WOMENS PO), Take 1 tablet by mouth daily., Disp: , Rfl:    tamsulosin (FLOMAX) 0.4 MG CAPS capsule, Take 0.4 mg by mouth daily as needed., Disp: , Rfl:  Medication Side Effects:  none  Family Medical/ Social History: Changes? No  MENTAL HEALTH EXAM:  There were no vitals taken for this visit.There is no height or weight on file to calculate BMI.  General Appearance: Casual, Neat, and Well Groomed  Eye Contact:  Good  Speech:  Clear and Coherent  Volume:  Normal  Mood:  NA  Affect:  Appropriate  Thought Process:  Coherent  Orientation:  Full (Time, Place, and Person)  Thought Content: Logical   Suicidal Thoughts:  No  Homicidal Thoughts:  No  Memory:  WNL  Judgement:  Fair  Insight:  Fair  Psychomotor  Activity:  Normal  Concentration:  Concentration: Good  Recall:  Fair  Fund of Knowledge: Fair  Language: Good  Assets:  Desire for Improvement Physical Health Resilience Social Support  ADL's:  Impaired  Cognition: Impaired,  Mild  Prognosis:  Poor    DIAGNOSES:    ICD-10-CM   1. Recurrent moderate major depressive disorder with anxiety (HCC)  F33.1 buPROPion (WELLBUTRIN XL) 300 MG 24 hr tablet   F41.9     2. MDD (major depressive disorder), recurrent episode, mild (HCC)  F33.0 buPROPion (WELLBUTRIN XL) 300 MG 24 hr tablet    3. Generalized anxiety disorder  F41.1 buPROPion (WELLBUTRIN XL) 300 MG 24 hr tablet      Receiving Psychotherapy: No    RECOMMENDATIONS:   Greater than 50% of  20  min face to face time with patient was spent on counseling and coordination of care. We reviewed her current stability. She wanted to check in with me and tell me about a couple of recent delusions she experienced with seeing parasites moving in her stool. She was checked by her PCP and labs were negative. I feel like this is related to her advancement of Parkinson. I recommended we continue to monitor and do not recommend adding another medication or antipsychotic to her regimen at this time.  We discussed her previous care and treatment plan. She will be moving to [facility] [place] [facility] to live with her daughter in December which she is Patent attorney. I highly recommended that her daughter locate PCP and psychiatry if needed in that location before the move for smooth transition. I told her that I will bridge her medication until established and provided refills.  To continue  Wellbutrin  to 300 mg XR daily She has already stopped her xanax and is not requesting more Will follow up if needed between now and December.  To report worsening symptoms promptly Provided emergency contact information Reviewed [place], NP

## 2022-07-09 ENCOUNTER — Ambulatory Visit: Payer: Medicare Other | Admitting: Behavioral Health

## 2022-07-21 ENCOUNTER — Other Ambulatory Visit: Payer: Self-pay | Admitting: *Deleted

## 2022-07-21 ENCOUNTER — Telehealth: Payer: Self-pay | Admitting: Behavioral Health

## 2022-07-21 ENCOUNTER — Telehealth: Payer: Self-pay | Admitting: Adult Health

## 2022-07-21 NOTE — Telephone Encounter (Signed)
Pt LVM @ 4:22p. She said about 1pm today she had an episode of mild hallucinations. She took a Xanax and after about 20 mins that seemed to help.  She doesn't want to get started back on Xanax.  She asked if there is anything you can prescribe for anti-anxiety.  No upcoming appts scheduled.

## 2022-07-21 NOTE — Telephone Encounter (Signed)
Pt is calling. Pt is crying saying her Parkinson has being worst the last three days. She said she can't walk and feels very weak and she is seeing things that are not there. Pt said she has a bladder infection that she is being treated for.  Pt is requesting a call back from the nurse.

## 2022-07-21 NOTE — Progress Notes (Deleted)
I spoke with the patient.  She states she is feeling much better than she was earlier.  She is on her third day of a 7-day course of cephalexin 500 mg twice daily. You

## 2022-07-21 NOTE — Telephone Encounter (Signed)
I spoke with the patient.  She states she is feeling much better than she was earlier.  She is on her third day of a 7-day course of cephalexin 500 mg twice daily.  She states earlier she noticed wolves on her bathroom floor.  She has a marble floor that has lines.  She states she decided to take a Xanax 0.25 mg tablet that she had (she had stopped this awhile back) and within 15 minutes everything became clear.  She states she would like to avoid Xanax if possible but she did ask for recommendations of things she can take for anxiety.  I advised her to discuss this with primary care.  Patient has had anxiety for a long time and she states she has mostly been managed with meditation and breathing.  She states 2 to 3 days ago she essentially had a fall by her bed but she states she did not go all the way down.  She says she touched down on her bottom and denies any injury nor hitting her head.  She does still have feelings of worsened balance and difficulty walking.  She states her legs are equally weak.  She will continue to monitor how she does on her antibiotics.  We discussed that urinary tract infections especially in the elderly can calls multiple symptoms that can even be of a psychiatric presentation.  Patient aware to proceed to the ER if she has any acute worsening or becomes unable to move.  She verbalized appreciation for the call. I added Pantoprazole 20 mg and Cephalexin 500 mg to her medication list. She verbalized appreciation for the call.

## 2022-07-21 NOTE — Telephone Encounter (Signed)
Please see message from patient.  You saw 10/4.  She is just on Wellbutrin and doesn't want Xanax.

## 2022-07-22 ENCOUNTER — Other Ambulatory Visit: Payer: Self-pay

## 2022-07-22 NOTE — Telephone Encounter (Signed)
See if she has tried Buspar, I can try 5 mg twice daily for starts to see if that helps. We are limited with anti anxiety meds due to risk with advanced age. See if she would like to try that.

## 2022-07-22 NOTE — Telephone Encounter (Signed)
Agree with what she has been told.  Certainly if she is having more issues with anxiety this should be discussed with her PCP.  After she finishes antibiotics if she is continuing to feel weak and trouble with her balance we can certainly consider physical therapy

## 2022-07-22 NOTE — Telephone Encounter (Signed)
Patient is agreeable to trying Buspar 5 mg BID. Have pended Rx for your approval.

## 2022-07-23 MED ORDER — BUSPIRONE HCL 5 MG PO TABS: 5.0000 mg | ORAL_TABLET | Freq: Two times a day (BID) | ORAL | 0 refills | Status: DC

## 2022-08-17 ENCOUNTER — Telehealth: Payer: Self-pay | Admitting: Behavioral Health

## 2022-08-17 ENCOUNTER — Other Ambulatory Visit: Payer: Self-pay | Admitting: Behavioral Health

## 2022-08-17 NOTE — Telephone Encounter (Signed)
See message from patient. Last filled alprazolam 10/07/21 from a different provider. We have never filled per the database. She was last on 0.5 mg prn, but no more than TID.

## 2022-08-17 NOTE — Telephone Encounter (Signed)
Patient Deborah Vincent stating she has tapered off the Xanax, but is exhibiting increased anxiety.  She is requesting a few Xanax for flying and putting her house on the market, unless there is an alternative. Please advise. Last seen 07/07/22 with no follow up scheduled.  Contact # [phone]

## 2022-08-19 ENCOUNTER — Other Ambulatory Visit: Payer: Self-pay | Admitting: Behavioral Health

## 2022-08-19 DIAGNOSIS — F411 Generalized anxiety disorder: Secondary | ICD-10-CM

## 2022-08-19 MED ORDER — ALPRAZOLAM 0.25 MG PO TABS: 0.2500 mg | ORAL_TABLET | ORAL | 0 refills | Status: AC | PRN

## 2022-08-19 NOTE — Telephone Encounter (Signed)
Patient notified

## 2022-08-19 NOTE — Telephone Encounter (Signed)
Changed strength and sent #15, xanax 0.25 mg daily as needed. Please advise her to not take more than one per day. Make sure she understands fall risk medication.

## 2022-08-23 ENCOUNTER — Other Ambulatory Visit: Payer: Self-pay | Admitting: Behavioral Health

## 2022-09-01 ENCOUNTER — Telehealth: Payer: Self-pay | Admitting: Physician Assistant

## 2022-09-01 NOTE — Telephone Encounter (Signed)
Received a call from patient's daughter, Sandi Raveling, last night reporting possible side effects from BuSpar.  She is on 5 mg twice daily, now having headache, dizziness, and nausea, since starting it.  No focal neurologic deficits.  I recommend holding the BuSpar last night and this morning.  Please contact patient's daughter to give further advice.

## 2022-10-14 ENCOUNTER — Other Ambulatory Visit: Payer: Self-pay | Admitting: Behavioral Health

## 2022-12-16 ENCOUNTER — Encounter: Payer: Self-pay | Admitting: Adult Health

## 2022-12-16 ENCOUNTER — Ambulatory Visit: Payer: Medicare Other | Admitting: Adult Health

## 2023-02-25 ENCOUNTER — Telehealth: Payer: Self-pay | Admitting: Behavioral Health

## 2023-02-25 NOTE — Telephone Encounter (Signed)
Pt LVM @ 12:05p.  She said she is having more depression symptoms since she went on the long lasting Wellbutrin.  She wants to know if maybe she should back to the regular Wellbutrin.  No upcoming appt scheduled

## 2023-02-25 NOTE — Telephone Encounter (Signed)
Please see message. She is due for F/U but was supposed to move to [place] with daughter in December.

## 2023-03-01 NOTE — Telephone Encounter (Signed)
LVM to RC 

## 2023-03-02 NOTE — Telephone Encounter (Signed)
Patient has moved to [place], is no longer a patient of [facility] and message was left in error.

## 2023-03-02 NOTE — Telephone Encounter (Signed)
Left second VM to [facility].  

## 2023-04-04 DEATH — deceased
